# Patient Record
Sex: Male | Born: 1955 | Race: White | Hispanic: No | Smoking: Former smoker
Health system: Southern US, Community
[De-identification: ages and names within clinical notes are randomized; demographics above are authoritative.]

## PROBLEM LIST (undated history)

## (undated) DIAGNOSIS — K8020 Calculus of gallbladder without cholecystitis without obstruction: Secondary | ICD-10-CM

## (undated) DIAGNOSIS — K5090 Crohn's disease, unspecified, without complications: Secondary | ICD-10-CM

## (undated) DIAGNOSIS — K56609 Unspecified intestinal obstruction, unspecified as to partial versus complete obstruction: Secondary | ICD-10-CM

---

## 2005-11-25 ENCOUNTER — Emergency Department (HOSPITAL_COMMUNITY): Admission: EM | Admit: 2005-11-25 | Discharge: 2005-11-25 | Payer: Self-pay | Admitting: Emergency Medicine

## 2013-04-03 ENCOUNTER — Emergency Department (HOSPITAL_COMMUNITY): Payer: Self-pay

## 2013-04-03 ENCOUNTER — Encounter (HOSPITAL_COMMUNITY): Payer: Self-pay | Admitting: Emergency Medicine

## 2013-04-03 ENCOUNTER — Inpatient Hospital Stay (HOSPITAL_COMMUNITY)
Admission: EM | Admit: 2013-04-03 | Discharge: 2013-04-06 | DRG: 386 | Disposition: A | Discharge status: Home / Self Care | Payer: Self-pay | Attending: Internal Medicine | Admitting: Internal Medicine

## 2013-04-03 DIAGNOSIS — K8020 Calculus of gallbladder without cholecystitis without obstruction: Secondary | ICD-10-CM | POA: Diagnosis present

## 2013-04-03 DIAGNOSIS — K5669 Other intestinal obstruction: Secondary | ICD-10-CM | POA: Diagnosis present

## 2013-04-03 DIAGNOSIS — T380X5A Adverse effect of glucocorticoids and synthetic analogues, initial encounter: Secondary | ICD-10-CM | POA: Diagnosis present

## 2013-04-03 DIAGNOSIS — K529 Noninfective gastroenteritis and colitis, unspecified: Secondary | ICD-10-CM | POA: Diagnosis present

## 2013-04-03 DIAGNOSIS — R112 Nausea with vomiting, unspecified: Secondary | ICD-10-CM

## 2013-04-03 DIAGNOSIS — K56609 Unspecified intestinal obstruction, unspecified as to partial versus complete obstruction: Secondary | ICD-10-CM | POA: Diagnosis present

## 2013-04-03 DIAGNOSIS — R1110 Vomiting, unspecified: Secondary | ICD-10-CM | POA: Diagnosis present

## 2013-04-03 DIAGNOSIS — K5090 Crohn's disease, unspecified, without complications: Secondary | ICD-10-CM

## 2013-04-03 DIAGNOSIS — K5000 Crohn's disease of small intestine without complications: Principal | ICD-10-CM | POA: Diagnosis present

## 2013-04-03 DIAGNOSIS — R1115 Cyclical vomiting syndrome unrelated to migraine: Secondary | ICD-10-CM

## 2013-04-03 DIAGNOSIS — Z87891 Personal history of nicotine dependence: Secondary | ICD-10-CM

## 2013-04-03 DIAGNOSIS — K5289 Other specified noninfective gastroenteritis and colitis: Secondary | ICD-10-CM | POA: Diagnosis present

## 2013-04-03 HISTORY — DX: Unspecified intestinal obstruction, unspecified as to partial versus complete obstruction: K56.609

## 2013-04-03 HISTORY — DX: Crohn's disease, unspecified, without complications: K50.90

## 2013-04-03 HISTORY — DX: Calculus of gallbladder without cholecystitis without obstruction: K80.20

## 2013-04-03 LAB — COMPREHENSIVE METABOLIC PANEL
ALT: 17 U/L (ref 0–53)
BUN: 11 mg/dL (ref 6–23)
CO2: 28 mEq/L (ref 19–32)
Chloride: 99 mEq/L (ref 96–112)
Creatinine, Ser: 1.05 mg/dL (ref 0.50–1.35)
GFR calc non Af Amer: 76 mL/min — ABNORMAL LOW (ref >90)
Glucose, Bld: 114 mg/dL — ABNORMAL HIGH (ref 70–99)

## 2013-04-03 LAB — CBC WITH DIFFERENTIAL/PLATELET
HCT: 49.3 % (ref 39.0–52.0)
Hemoglobin: 17 g/dL (ref 13.0–17.0)
MCV: 80.4 fL (ref 78.0–100.0)
Monocytes Absolute: 0.9 10*3/uL (ref 0.1–1.0)
Monocytes Relative: 6 % (ref 3–12)
Neutrophils Relative %: 89 % — ABNORMAL HIGH (ref 43–77)
Platelets: 361 10*3/uL (ref 150–400)
RDW: 14.3 % (ref 11.5–15.5)

## 2013-04-03 LAB — URINALYSIS, ROUTINE W REFLEX MICROSCOPIC
Glucose, UA: NEGATIVE mg/dL
Hgb urine dipstick: NEGATIVE
Nitrite: NEGATIVE
Specific Gravity, Urine: 1.015 (ref 1.005–1.030)
Urobilinogen, UA: 0.2 mg/dL (ref 0.0–1.0)

## 2013-04-03 LAB — LACTIC ACID, PLASMA: Lactic Acid, Venous: 1.9 mmol/L (ref 0.5–2.2)

## 2013-04-03 MED ORDER — IOHEXOL 300 MG/ML  SOLN
100.0000 mL | Freq: Once | INTRAMUSCULAR | Status: AC | PRN
  Administered 2013-04-03: 100 mL via INTRAVENOUS

## 2013-04-03 MED ORDER — METHYLPREDNISOLONE SODIUM SUCC 40 MG IJ SOLR
40.0000 mg | Freq: Two times a day (BID) | INTRAMUSCULAR | Status: DC
  Administered 2013-04-04 – 2013-04-06 (×5): 40 mg via INTRAVENOUS
  Filled 2013-04-03 (×5): qty 1

## 2013-04-03 MED ORDER — CIPROFLOXACIN IN D5W 400 MG/200ML IV SOLN
INTRAVENOUS | Status: AC
  Filled 2013-04-03: qty 200

## 2013-04-03 MED ORDER — MORPHINE SULFATE 4 MG/ML IJ SOLN
4.0000 mg | INTRAMUSCULAR | Status: DC | PRN
  Administered 2013-04-03: 4 mg via INTRAVENOUS
  Filled 2013-04-03: qty 1

## 2013-04-03 MED ORDER — ACETAMINOPHEN 325 MG PO TABS
650.0000 mg | ORAL_TABLET | Freq: Four times a day (QID) | ORAL | Status: DC | PRN
  Administered 2013-04-03: 650 mg via ORAL
  Filled 2013-04-03 (×2): qty 2

## 2013-04-03 MED ORDER — CIPROFLOXACIN IN D5W 400 MG/200ML IV SOLN
400.0000 mg | Freq: Once | INTRAVENOUS | Status: AC
  Administered 2013-04-03: 400 mg via INTRAVENOUS
  Filled 2013-04-03: qty 200

## 2013-04-03 MED ORDER — ONDANSETRON HCL 4 MG PO TABS: 4.0000 mg | ORAL_TABLET | Freq: Four times a day (QID) | ORAL | Status: DC | PRN

## 2013-04-03 MED ORDER — METRONIDAZOLE IN NACL 5-0.79 MG/ML-% IV SOLN
500.0000 mg | Freq: Three times a day (TID) | INTRAVENOUS | Status: DC
  Administered 2013-04-04 – 2013-04-05 (×5): 500 mg via INTRAVENOUS
  Filled 2013-04-03 (×8): qty 100

## 2013-04-03 MED ORDER — POTASSIUM CHLORIDE IN NACL 20-0.9 MEQ/L-% IV SOLN
INTRAVENOUS | Status: DC
  Administered 2013-04-03 – 2013-04-06 (×5): via INTRAVENOUS

## 2013-04-03 MED ORDER — ONDANSETRON HCL 4 MG/2ML IJ SOLN: 4.0000 mg | Freq: Four times a day (QID) | INTRAMUSCULAR | Status: DC | PRN

## 2013-04-03 MED ORDER — HYDROMORPHONE HCL PF 1 MG/ML IJ SOLN: 1.0000 mg | INTRAMUSCULAR | Status: DC | PRN

## 2013-04-03 MED ORDER — PROMETHAZINE HCL 25 MG/ML IJ SOLN
12.5000 mg | Freq: Once | INTRAMUSCULAR | Status: AC
  Administered 2013-04-03: 12.5 mg via INTRAVENOUS
  Filled 2013-04-03: qty 1

## 2013-04-03 MED ORDER — ACETAMINOPHEN 650 MG RE SUPP: 650.0000 mg | Freq: Four times a day (QID) | RECTAL | Status: DC | PRN

## 2013-04-03 MED ORDER — IOHEXOL 300 MG/ML  SOLN
50.0000 mL | Freq: Once | INTRAMUSCULAR | Status: AC | PRN
  Administered 2013-04-03: 50 mL via ORAL

## 2013-04-03 MED ORDER — ONDANSETRON HCL 4 MG/2ML IJ SOLN
4.0000 mg | INTRAMUSCULAR | Status: AC | PRN
  Administered 2013-04-03 (×2): 4 mg via INTRAVENOUS
  Filled 2013-04-03 (×2): qty 2

## 2013-04-03 MED ORDER — ENOXAPARIN SODIUM 40 MG/0.4ML ~~LOC~~ SOLN
40.0000 mg | SUBCUTANEOUS | Status: DC
  Administered 2013-04-04 – 2013-04-05 (×2): 40 mg via SUBCUTANEOUS
  Filled 2013-04-03 (×2): qty 0.4

## 2013-04-03 MED ORDER — MORPHINE SULFATE 4 MG/ML IJ SOLN
4.0000 mg | INTRAMUSCULAR | Status: AC | PRN
  Administered 2013-04-03 (×2): 4 mg via INTRAVENOUS
  Filled 2013-04-03 (×2): qty 1

## 2013-04-03 MED ORDER — CIPROFLOXACIN IN D5W 400 MG/200ML IV SOLN
400.0000 mg | Freq: Two times a day (BID) | INTRAVENOUS | Status: DC
  Administered 2013-04-04 – 2013-04-05 (×3): 400 mg via INTRAVENOUS
  Filled 2013-04-03 (×5): qty 200

## 2013-04-03 MED ORDER — METRONIDAZOLE IN NACL 5-0.79 MG/ML-% IV SOLN
500.0000 mg | Freq: Once | INTRAVENOUS | Status: AC
  Administered 2013-04-03: 500 mg via INTRAVENOUS
  Filled 2013-04-03: qty 100

## 2013-04-03 MED ORDER — SODIUM CHLORIDE 0.9 % IV SOLN
INTRAVENOUS | Status: DC
  Administered 2013-04-03: 14:00:00 via INTRAVENOUS

## 2013-04-03 MED ORDER — METRONIDAZOLE IN NACL 5-0.79 MG/ML-% IV SOLN
INTRAVENOUS | Status: AC
  Filled 2013-04-03: qty 200

## 2013-04-03 NOTE — H&P (Signed)
[facility] History and Physical  Lucas Rhodes ZOX:[id number] DOB: 03/31/1956 DOA: 04/03/2013  Referring physician: Dr. Clarene Duke, ER physician PCP: No PCP Per Patient  Specialists:   Chief Complaint: Abdominal pain and vomiting  HPI: Lucas Rhodes is a 57 y.o. male with a history of inflammatory bowel disease, presents to the emergency room with vomiting and abdominal pain. Patient reports that he was diagnosed approximately 10-12 years ago in [place] and was told that he had ulcerative colitis. There is some confusion between the patient and family members if he was told ulcerative colitis versus Crohn's disease. Patient did not have any medical insurance and was unable to obtain any treatment for this. He reports having chronic diarrhea, ranging between 2-7 times per day. He also occasionally gets abdominal pain with vomiting, but is usually able to get through these episodes. He reports that yesterday, he developed abdominal cramping, diffusely which began in the morning. This progressed throughout the day. In the evening, he developed persistent vomiting. He reports his last normal bowel movement being 2-3 days ago. He's also been able to pass flatus but has been belching. When his vomiting persisted, he presents to the emergency room for evaluation. He denies any hematemesis, melena, hematochezia. CT scan of the abdomen and pelvis indicated an inflammatory colitis as well as a functional bowel obstruction. Patient will be admitted for further treatment.  Review of Systems: Pertinent positives as per history of present illness, otherwise negative  Past Medical History  Diagnosis Date  . Crohn's disease    History reviewed. No pertinent past surgical history. Social History:  reports that he has quit smoking. He does not have any smokeless tobacco history on file. He reports that he does not drink alcohol or use illicit drugs.   No Known Allergies  Family history:  Reviewed, no known history of inflammatory bowel disease  Prior to Admission medications   Medication Sig Start Date End Date Taking? Authorizing Provider  acetaminophen (TYLENOL) 500 MG tablet Take 500 mg by mouth every 6 (six) hours as needed for pain.   Yes Historical Provider, MD  aspirin 81 MG chewable tablet Chew 81 mg by mouth daily.   Yes Historical Provider, MD   Physical Exam: Filed Vitals:   04/03/13 1705  BP: 111/84  Pulse: 83  Temp:   Resp: 18     General:  No acute distress  Eyes: Pupils are equal, round, reactive to  ENT: Mucous membranes are moist, dentition is poor  Neck: Supple  Cardiovascular: S1, S2, regular rate and rhythm  Respiratory: Clear to auscultation bilaterally  Abdomen: Soft, nontender, positive bowel sounds  Skin: No rashes  Musculoskeletal: No pedal edema bilaterally  Psychiatric: Normal affect, cooperative with exam  Neurologic: Grossly intact, nonfocal  Labs on Admission:  Basic Metabolic Panel:  Recent Labs Lab 04/03/13 1356  NA 138  K 3.9  CL 99  CO2 28  GLUCOSE 114*  BUN 11  CREATININE 1.05  CALCIUM 9.8   Liver Function Tests:  Recent Labs Lab 04/03/13 1356  AST 18  ALT 17  ALKPHOS 104  BILITOT 1.4*  PROT 7.6  ALBUMIN 3.6    Recent Labs Lab 04/03/13 1356  LIPASE 17   No results found for this basename: AMMONIA,  in the last 168 hours CBC:  Recent Labs Lab 04/03/13 1356  WBC 14.4*  NEUTROABS 12.7*  HGB 17.0  HCT 49.3  MCV 80.4  PLT 361   Cardiac Enzymes: No results found for  this basename: CKTOTAL, CKMB, CKMBINDEX, TROPONINI,  in the last 168 hours  BNP (last 3 results) No results found for this basename: PROBNP,  in the last 8760 hours CBG: No results found for this basename: GLUCAP,  in the last 168 hours  Radiological Exams on Admission: Dg Chest 2 View  04/03/2013   CLINICAL DATA:  Vomiting and chest and abdominal pain.  EXAM: CHEST  2 VIEW  COMPARISON:  11/25/2005.  FINDINGS: The  cardiac silhouette, mediastinal and hilar contours are normal and stable. The lungs are clear. Stable eventration of the right hemidiaphragm. The bony thorax is intact.  IMPRESSION: No acute cardiopulmonary findings.   Electronically Signed   By: Loralie Champagne M.D.   On: 04/03/2013 14:26   Ct Abdomen Pelvis W Contrast  04/03/2013   CLINICAL DATA:  Nausea, vomiting and diarrhea. History of Crohn's disease and ulcerative colitis.  EXAM: CT ABDOMEN AND PELVIS WITH CONTRAST  TECHNIQUE: Multidetector CT imaging of the abdomen and pelvis was performed using the standard protocol following bolus administration of intravenous contrast.  CONTRAST:  OMNIPAQUE IOHEXOL 300 MG/ML  SOLN  COMPARISON:  None.  FINDINGS: The lung bases are clear.  The liver is unremarkable. No focal lesions or biliary dilatation. The gallbladder is distended and there are numerous layering gallstones. No wall thickening or pericholecystic fluid. No common bowel duct dilatation. The pancreas is normal. The spleen is normal. The adrenal glands and kidneys are normal.  The stomach is mildly distended with contrast. There is a moderate to large duodenum diverticulum noted. The mid distal small bowel loops are dilated and fluid-filled. There is moderate mucosal enhancement involving the distal and terminal ileum which suggests acute inflammation/Crohn's disease. There is also fibrofatty infiltrative changes involving the wall of the small bowel which is consistent with chronic inflammatory bowel disease. No abscess or obstruction.  No mesenteric or retroperitoneal mass or adenopathy. The aorta and branch vessels are normal. The portal, splenic and renal veins are patent. The colon demonstrates mild fibro fatty infiltrated changes also consistent with remote inflammatory bowel disease. No acute inflammation.  The bladder, prostate gland and seminal vesicles are unremarkable. No pelvic mass or adenopathy. No free pelvic fluid collections. No  inguinal mass or adenopathy.  The bony structures are unremarkable.  IMPRESSION: Acute on chronic inflammation involving the distal and terminal ileum suggesting active Crohn's disease superimposed on remote disease. No obstruction or abscess. Mildly dilated more proximal loops of small bowel likely due to a functional obstruction.  Cholelithiasis.   Electronically Signed   By: Loralie Champagne M.D.   On: 04/03/2013 16:22   Assessment/Plan Principal Problem:   Small bowel obstruction Active Problems:   Inflammatory bowel disease   Vomiting   1. Inflammatory bowel disease, possible ulcerative colitis. Patient was started empirically on ciprofloxacin and Flagyl. We will also start him on IV steroids. GI consultation for further guidance. 2. Functional small bowel obstruction. His vomiting appears to have improved since arrival to the hospital. If vomiting does persist, we will have to place NG tube for decompression, patient is agreeable. Keep the patient n.p.o. except ice chips and advance diet as tolerated, once he is moving his bowels. 3. Vomiting. Treat supportively. Provide IV fluids to avoid dehydration.  Code Status: full code Family Communication: discussed with patient and family at the bedside Disposition Plan: discharge home once improved  Time spent: 50 mins  MEMON,JEHANZEB [facility] Pager [phone]  If 7PM-7AM, please contact night-coverage [email or web address] Password [facility] 04/03/2013, 5:51  PM       

## 2013-04-03 NOTE — ED Notes (Addendum)
Pt c/o abd pain with  N/v/d since early this am.  Reports has crohn's disease but isn't on any medication for it.  Reports ate a lot of peanuts yesterday and thinks may have caused his symptoms.  EMS started IV and administered 4mg  zofran iv.

## 2013-04-03 NOTE — ED Provider Notes (Signed)
CSN: [id number]     Arrival date & time 04/03/13  1143 History   First MD Initiated Contact with Patient 04/03/13 1314     Chief Complaint  Patient presents with  . Abdominal Pain  . Emesis    HPI Pt was seen at 1335. Per pt, c/o gradual onset and persistence of multiple intermittent episodes of N/V that began last night.  Has been associated with generalized abd "aching" and "cramping" pain. States he "ate a lot of peanuts" yesterday which has caused these same symptoms previously. States he has not had his usual chronic diarrhea (due to Crohn's vs ulcerative colitis) since yesterday. Endorses he feels "like I have a blockage."  Denies diarrhea, no CP/SOB, no back pain, no fevers, no black or blood in stools or emesis.     Past Medical History  Diagnosis Date  . Crohn's disease    History reviewed. No pertinent past surgical history.  History  Substance Use Topics  . Smoking status: Former Games developer  . Smokeless tobacco: Not on file  . Alcohol Use: No    Review of Systems ROS: Statement: All systems negative except as marked or noted in the HPI; Constitutional: Negative for fever and chills. ; ; Eyes: Negative for eye pain, redness and discharge. ; ; ENMT: Negative for ear pain, hoarseness, nasal congestion, sinus pressure and sore throat. ; ; Cardiovascular: Negative for chest pain, palpitations, diaphoresis, dyspnea and peripheral edema. ; ; Respiratory: Negative for cough, wheezing and stridor. ; ; Gastrointestinal: +N/V, abd pain. Negative for diarrhea, blood in stool, hematemesis, jaundice and rectal bleeding. . ; ; Genitourinary: Negative for dysuria, flank pain and hematuria. ; ; Musculoskeletal: Negative for back pain and neck pain. Negative for swelling and trauma.; ; Skin: Negative for pruritus, rash, abrasions, blisters, bruising and skin lesion.; ; Neuro: Negative for headache, lightheadedness and neck stiffness. Negative for weakness, altered level of consciousness , altered  mental status, extremity weakness, paresthesias, involuntary movement, seizure and syncope.       Allergies  Review of patient's allergies indicates no known allergies.  Home Medications   Current Outpatient Rx  Name  Route  Sig  Dispense  Refill  . acetaminophen (TYLENOL) 500 MG tablet   Oral   Take 500 mg by mouth every 6 (six) hours as needed for pain.         Marland Kitchen aspirin 81 MG chewable tablet   Oral   Chew 81 mg by mouth daily.          BP 113/56  Pulse 99  Temp(Src) 98 F (36.7 C) (Oral)  Resp 18  Ht 5\' 8"  (1.727 m)  Wt 170 lb (77.111 kg)  BMI 25.85 kg/m2 Physical Exam 1340: Physical examination:  Nursing notes reviewed; Vital signs and O2 SAT reviewed;  Constitutional: Well developed, Well nourished, Well hydrated, In no acute distress; Head:  Normocephalic, atraumatic; Eyes: EOMI, PERRL, No scleral icterus; ENMT: Mouth and pharynx normal, Mucous membranes moist; Neck: Supple, Full range of motion, No lymphadenopathy; Cardiovascular: Regular rate and rhythm, No murmur, rub, or gallop; Respiratory: Breath sounds clear & equal bilaterally, No rales, rhonchi, wheezes.  Speaking full sentences with ease, Normal respiratory effort/excursion; Chest: Nontender, Movement normal; Abdomen: Soft, +diffuse tenderness to palp. No rebound or guarding. Nondistended, Normal bowel sounds; Genitourinary: No CVA tenderness; Extremities: Pulses normal, No tenderness, No edema, No calf edema or asymmetry.; Neuro: AA&Ox3, Major CN grossly intact.  Speech clear. No gross focal motor or sensory deficits in extremities.; Skin:  Color normal, Warm, Dry.   ED Course  Procedures   EKG Interpretation   None       MDM  MDM Reviewed: previous chart, nursing note and vitals Reviewed previous: labs Interpretation: labs, x-ray and CT scan     Results for orders placed during the hospital encounter of 04/03/13  CBC WITH DIFFERENTIAL      Result Value Range   WBC 14.4 (*) 4.0 - 10.5 K/uL    RBC 6.13 (*) 4.22 - 5.81 MIL/uL   Hemoglobin 17.0  13.0 - 17.0 g/dL   HCT 16.1  09.6 - 04.5 %   MCV 80.4  78.0 - 100.0 fL   MCH 27.7  26.0 - 34.0 pg   MCHC 34.5  30.0 - 36.0 g/dL   RDW 40.9  81.1 - 91.4 %   Platelets 361  150 - 400 K/uL   Neutrophils Relative % 89 (*) 43 - 77 %   Neutro Abs 12.7 (*) 1.7 - 7.7 K/uL   Lymphocytes Relative 5 (*) 12 - 46 %   Lymphs Abs 0.8  0.7 - 4.0 K/uL   Monocytes Relative 6  3 - 12 %   Monocytes Absolute 0.9  0.1 - 1.0 K/uL   Eosinophils Relative 0  0 - 5 %   Eosinophils Absolute 0.0  0.0 - 0.7 K/uL   Basophils Relative 0  0 - 1 %   Basophils Absolute 0.0  0.0 - 0.1 K/uL  COMPREHENSIVE METABOLIC PANEL      Result Value Range   Sodium 138  135 - 145 mEq/L   Potassium 3.9  3.5 - 5.1 mEq/L   Chloride 99  96 - 112 mEq/L   CO2 28  19 - 32 mEq/L   Glucose, Bld 114 (*) 70 - 99 mg/dL   BUN 11  6 - 23 mg/dL   Creatinine, Ser 7.82  0.50 - 1.35 mg/dL   Calcium 9.8  8.4 - 95.6 mg/dL   Total Protein 7.6  6.0 - 8.3 g/dL   Albumin 3.6  3.5 - 5.2 g/dL   AST 18  0 - 37 U/L   ALT 17  0 - 53 U/L   Alkaline Phosphatase 104  39 - 117 U/L   Total Bilirubin 1.4 (*) 0.3 - 1.2 mg/dL   GFR calc non Af Amer 76 (*) >90 mL/min   GFR calc Af Amer 89 (*) >90 mL/min  LIPASE, BLOOD      Result Value Range   Lipase 17  11 - 59 U/L  LACTIC ACID, PLASMA      Result Value Range   Lactic Acid, Venous 1.9  0.5 - 2.2 mmol/L  URINALYSIS, ROUTINE W REFLEX MICROSCOPIC      Result Value Range   Color, Urine YELLOW  YELLOW   APPearance CLEAR  CLEAR   Specific Gravity, Urine 1.015  1.005 - 1.030   pH 8.5 (*) 5.0 - 8.0   Glucose, UA NEGATIVE  NEGATIVE mg/dL   Hgb urine dipstick NEGATIVE  NEGATIVE   Bilirubin Urine NEGATIVE  NEGATIVE   Ketones, ur NEGATIVE  NEGATIVE mg/dL   Protein, ur NEGATIVE  NEGATIVE mg/dL   Urobilinogen, UA 0.2  0.0 - 1.0 mg/dL   Nitrite NEGATIVE  NEGATIVE   Leukocytes, UA NEGATIVE  NEGATIVE   Dg Chest 2 View 04/03/2013   CLINICAL DATA:  Vomiting and  chest and abdominal pain.  EXAM: CHEST  2 VIEW  COMPARISON:  11/25/2005.  FINDINGS: The cardiac silhouette, mediastinal  and hilar contours are normal and stable. The lungs are clear. Stable eventration of the right hemidiaphragm. The bony thorax is intact.  IMPRESSION: No acute cardiopulmonary findings.   Electronically Signed   By: Loralie Champagne M.D.   On: 04/03/2013 14:26   Ct Abdomen Pelvis W Contrast 04/03/2013   CLINICAL DATA:  Nausea, vomiting and diarrhea. History of Crohn's disease and ulcerative colitis.  EXAM: CT ABDOMEN AND PELVIS WITH CONTRAST  TECHNIQUE: Multidetector CT imaging of the abdomen and pelvis was performed using the standard protocol following bolus administration of intravenous contrast.  CONTRAST:  OMNIPAQUE IOHEXOL 300 MG/ML  SOLN  COMPARISON:  None.  FINDINGS: The lung bases are clear.  The liver is unremarkable. No focal lesions or biliary dilatation. The gallbladder is distended and there are numerous layering gallstones. No wall thickening or pericholecystic fluid. No common bowel duct dilatation. The pancreas is normal. The spleen is normal. The adrenal glands and kidneys are normal.  The stomach is mildly distended with contrast. There is a moderate to large duodenum diverticulum noted. The mid distal small bowel loops are dilated and fluid-filled. There is moderate mucosal enhancement involving the distal and terminal ileum which suggests acute inflammation/Crohn's disease. There is also fibrofatty infiltrative changes involving the wall of the small bowel which is consistent with chronic inflammatory bowel disease. No abscess or obstruction.  No mesenteric or retroperitoneal mass or adenopathy. The aorta and branch vessels are normal. The portal, splenic and renal veins are patent. The colon demonstrates mild fibro fatty infiltrated changes also consistent with remote inflammatory bowel disease. No acute inflammation.  The bladder, prostate gland and seminal vesicles  are unremarkable. No pelvic mass or adenopathy. No free pelvic fluid collections. No inguinal mass or adenopathy.  The bony structures are unremarkable.  IMPRESSION: Acute on chronic inflammation involving the distal and terminal ileum suggesting active Crohn's disease superimposed on remote disease. No obstruction or abscess. Mildly dilated more proximal loops of small bowel likely due to a functional obstruction.  Cholelithiasis.   Electronically Signed   By: Loralie Champagne M.D.   On: 04/03/2013 16:22    1650:  Pt continues to c/o nausea and abd pain despite multiple doses of IV meds. Will re-dose pain meds, IV abx, admit. Dx and testing d/w pt and family.  Questions answered.  Verb understanding, agreeable to admit.  T/C to [facility] Dr. Kerry Hough, case discussed, including:  HPI, pertinent PM/SHx, VS/PE, dx testing, ED course and treatment:  Agreeable to admit, requests to write temporary orders, obtain medical bed to team 1.   Laray Anger, DO 04/06/13 [phone]

## 2013-04-04 DIAGNOSIS — K5000 Crohn's disease of small intestine without complications: Secondary | ICD-10-CM

## 2013-04-04 DIAGNOSIS — K5669 Other intestinal obstruction: Secondary | ICD-10-CM

## 2013-04-04 DIAGNOSIS — K5289 Other specified noninfective gastroenteritis and colitis: Secondary | ICD-10-CM

## 2013-04-04 LAB — CBC
HCT: 42.1 % (ref 39.0–52.0)
Hemoglobin: 14 g/dL (ref 13.0–17.0)
MCH: 27.5 pg (ref 26.0–34.0)
MCHC: 33.3 g/dL (ref 30.0–36.0)
Platelets: 278 10*3/uL (ref 150–400)
RDW: 14.6 % (ref 11.5–15.5)

## 2013-04-04 LAB — BASIC METABOLIC PANEL
BUN: 11 mg/dL (ref 6–23)
Calcium: 8.1 mg/dL — ABNORMAL LOW (ref 8.4–10.5)
Chloride: 103 mEq/L (ref 96–112)
Creatinine, Ser: 1.06 mg/dL (ref 0.50–1.35)
GFR calc Af Amer: 88 mL/min — ABNORMAL LOW (ref >90)
GFR calc non Af Amer: 76 mL/min — ABNORMAL LOW (ref >90)
Glucose, Bld: 99 mg/dL (ref 70–99)
Potassium: 4.1 mEq/L (ref 3.5–5.1)
Sodium: 138 mEq/L (ref 135–145)

## 2013-04-04 LAB — C-REACTIVE PROTEIN: CRP: 4.8 mg/dL — ABNORMAL HIGH (ref <0.60)

## 2013-04-04 NOTE — Plan of Care (Signed)
Dr. Karilyn Cota called and informed that pt records from [facility] were not retrievable.  The hospital had no records relating to that pt and that diagnosis.

## 2013-04-04 NOTE — Progress Notes (Signed)
[facility] PROGRESS NOTE  TAHMID STONEHOCKER ZOX:[id number] DOB: 30-Jun-1955 DOA: 04/03/2013 PCP: No PCP Per Patient  Assessment/Plan: 1. Inflammatory bowel disease. Appreciate GI assistance. Case discussed with Dr. Dionicia Abler and after review of the CT, it appears that patient has findings that may be more consistent with Crohn's disease. Agrees with empiric antibiotics and IV steroids at this time. He will need a colonoscopy once his acute issues have resolved. 2. Functional small bowel obstruction. Patient has not had further vomiting since coming to the hospital. He is starting to pass flatus. Continue current treatments. Start the patient on clear liquids. 3. Vomiting. Appears to have resolved. Continue to treat supportively.  Code Status: full code Family Communication: discussed with patient, no family present Disposition Plan: discharge home once improved   Consultants:  Gastroenterology, Dr. Karilyn Cota  Procedures:  none  Antibiotics:  cipro 10/18>>  Flagyl 10/18>>  HPI/Subjective: Patient is feeling better today. He's not had further vomiting. He is starting to pass flatus.  Objective: Filed Vitals:   04/04/13 1500  BP: 113/71  Pulse: 65  Temp: 97.9 F (36.6 C)  Resp: 19    Intake/Output Summary (Last 24 hours) at 04/04/13 1533 Last data filed at 04/04/13 1500  Gross per 24 hour  Intake 1398.33 ml  Output      0 ml  Net 1398.33 ml   Filed Weights   04/03/13 1146 04/03/13 1902  Weight: 77.111 kg (170 lb) 75.2 kg (165 lb 12.6 oz)    Exam:   General:  NAD  Cardiovascular: S1, S2 RRR  Respiratory: CTA B  Abdomen: soft, nt, nd, bs+  Musculoskeletal: no pedal edema b/l   Data Reviewed: Basic Metabolic Panel:  Recent Labs Lab 04/03/13 1356 04/04/13 0553  NA 138 138  K 3.9 4.1  CL 99 103  CO2 28 28  GLUCOSE 114* 99  BUN 11 11  CREATININE 1.05 1.06  CALCIUM 9.8 8.1*   Liver Function Tests:  Recent Labs Lab 04/03/13 1356  AST 18  ALT  17  ALKPHOS 104  BILITOT 1.4*  PROT 7.6  ALBUMIN 3.6    Recent Labs Lab 04/03/13 1356  LIPASE 17   No results found for this basename: AMMONIA,  in the last 168 hours CBC:  Recent Labs Lab 04/03/13 1356 04/04/13 0553  WBC 14.4* 7.1  NEUTROABS 12.7*  --   HGB 17.0 14.0  HCT 49.3 42.1  MCV 80.4 82.5  PLT 361 278   Cardiac Enzymes: No results found for this basename: CKTOTAL, CKMB, CKMBINDEX, TROPONINI,  in the last 168 hours BNP (last 3 results) No results found for this basename: PROBNP,  in the last 8760 hours CBG: No results found for this basename: GLUCAP,  in the last 168 hours  No results found for this or any previous visit (from the past 240 hour(s)).   Studies: Dg Chest 2 View  04/03/2013   CLINICAL DATA:  Vomiting and chest and abdominal pain.  EXAM: CHEST  2 VIEW  COMPARISON:  11/25/2005.  FINDINGS: The cardiac silhouette, mediastinal and hilar contours are normal and stable. The lungs are clear. Stable eventration of the right hemidiaphragm. The bony thorax is intact.  IMPRESSION: No acute cardiopulmonary findings.   Electronically Signed   By: Loralie Champagne M.D.   On: 04/03/2013 14:26   Ct Abdomen Pelvis W Contrast  04/03/2013   CLINICAL DATA:  Nausea, vomiting and diarrhea. History of Crohn's disease and ulcerative colitis.  EXAM: CT ABDOMEN AND PELVIS WITH  CONTRAST  TECHNIQUE: Multidetector CT imaging of the abdomen and pelvis was performed using the standard protocol following bolus administration of intravenous contrast.  CONTRAST:  OMNIPAQUE IOHEXOL 300 MG/ML  SOLN  COMPARISON:  None.  FINDINGS: The lung bases are clear.  The liver is unremarkable. No focal lesions or biliary dilatation. The gallbladder is distended and there are numerous layering gallstones. No wall thickening or pericholecystic fluid. No common bowel duct dilatation. The pancreas is normal. The spleen is normal. The adrenal glands and kidneys are normal.  The stomach is mildly  distended with contrast. There is a moderate to large duodenum diverticulum noted. The mid distal small bowel loops are dilated and fluid-filled. There is moderate mucosal enhancement involving the distal and terminal ileum which suggests acute inflammation/Crohn's disease. There is also fibrofatty infiltrative changes involving the wall of the small bowel which is consistent with chronic inflammatory bowel disease. No abscess or obstruction.  No mesenteric or retroperitoneal mass or adenopathy. The aorta and branch vessels are normal. The portal, splenic and renal veins are patent. The colon demonstrates mild fibro fatty infiltrated changes also consistent with remote inflammatory bowel disease. No acute inflammation.  The bladder, prostate gland and seminal vesicles are unremarkable. No pelvic mass or adenopathy. No free pelvic fluid collections. No inguinal mass or adenopathy.  The bony structures are unremarkable.  IMPRESSION: Acute on chronic inflammation involving the distal and terminal ileum suggesting active Crohn's disease superimposed on remote disease. No obstruction or abscess. Mildly dilated more proximal loops of small bowel likely due to a functional obstruction.  Cholelithiasis.   Electronically Signed   By: Loralie Champagne M.D.   On: 04/03/2013 16:22    Scheduled Meds: . ciprofloxacin  400 mg Intravenous Q12H  . enoxaparin (LOVENOX) injection  40 mg Subcutaneous Q24H  . methylPREDNISolone (SOLU-MEDROL) injection  40 mg Intravenous Q12H  . metronidazole  500 mg Intravenous Q8H   Continuous Infusions: . 0.9 % NaCl with KCl 20 mEq / L 100 mL/hr at 04/04/13 4782    Principal Problem:   Small bowel obstruction Active Problems:   Inflammatory bowel disease   Vomiting    Time spent:    [facility]  [facility] Pager [phone]. If 7PM-7AM, please contact night-coverage at [email or web address], password [facility] 04/04/2013, 3:33 PM  LOS: 1 day

## 2013-04-04 NOTE — Consult Note (Signed)
Referring Provider: No ref. provider found Primary Care Physician:  No PCP Per Patient Primary Gastroenterologist:  Dr. Karilyn Cota  Reason for Consultation:  Small bowel obstruction in a patient with known history of inflammatory bowel disease.  HPI:  Patient is 57 year old Caucasian male who has history of inflammatory bowel disease with chronic diarrhea and no followup and or medications would develop cramping abdominal pain 2 days did not feel well. He also noted abdominal distention and began vomiting at 2 AM yesterday morning. He therefore came to the emergency room and on evaluation felt to have small bowel obstruction secondary to distal small bowel inflammation consistent with Crohn's disease. He is feeling much better with current therapy. Patient states he was diagnosed with Crohn's disease about 12 years ago. He took medication for a short while but does not remember the specifics. About 10 years ago he was seen by another physician and underwent colonoscopy and was told that he had ulcerative colitis. He he did not return for followup visit because of lack of insurance and his inability to pay for the visit and her medications. He states he is done quite well with chronic diarrhea. He has anywhere from 1-10 stools per day but on most days 3-4. Most of the stools or loose and watery and every now and then semi-formed. He denies melena rectal bleeding fever or chills. Also denies frequent heartburn. He does not believe he has lost any weight. He has not able to walk much over the last few years. 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 He does not take OTC NSAIDs. He occasionally takes Tylenol for headache. He had small BM yesterday morning. Since admission he has not passed any flatus or feces. He has noted significant decrease in abdominal distention since  admission. He presently denies abdominal pain. He is divorced and has 3 grown up children. He smokes cigarettes for 10 years but quit when he was 57 years old. He does not smoke cigarettes. Family history is negative for inflammatory bowel disease. His father is deceased. He lived to be 80. His mother is 19 and in good health. Patient stays with his mother  Past Medical History  Diagnosis Date  . Crohn's disease     History reviewed. No pertinent past surgical history.  Prior to Admission medications   Medication Sig Start Date End Date Taking? Authorizing Provider  acetaminophen (TYLENOL) 500 MG tablet Take 500 mg by mouth every 6 (six) hours as needed for pain.   Yes Historical Provider, MD  aspirin 81 MG chewable tablet Chew 81 mg by mouth daily.   Yes Historical Provider, MD    Current Facility-Administered Medications  Medication Dose Route Frequency Provider Last Rate Last Dose  . 0.9 % NaCl with KCl 20 mEq/ L  infusion   Intravenous Continuous Erick Blinks, MD 100 mL/hr at 04/04/13 0819    . acetaminophen (TYLENOL) tablet 650 mg  650 mg Oral Q6H PRN Erick Blinks, MD   650 mg at 04/03/13 2012   Or  . acetaminophen (TYLENOL) suppository 650 mg  650 mg Rectal Q6H PRN Erick Blinks, MD      . ciprofloxacin (CIPRO) IVPB 400 mg  400 mg Intravenous Q12H Erick Blinks, MD   400 mg at 04/04/13 0551  . enoxaparin (LOVENOX) injection  40 mg  40 mg Subcutaneous Q24H Erick Blinks, MD      . HYDROmorphone (DILAUDID) injection 1 mg  1 mg Intravenous Q4H PRN Erick Blinks, MD      . methylPREDNISolone sodium succinate (SOLU-MEDROL) 40 mg/mL injection 40 mg  40 mg Intravenous Q12H Erick Blinks, MD   40 mg at 04/04/13 0551  . metroNIDAZOLE (FLAGYL) IVPB 500 mg  500 mg Intravenous Q8H Erick Blinks, MD   500 mg at 04/04/13 0851  . ondansetron (ZOFRAN) tablet 4 mg  4 mg Oral Q6H PRN Erick Blinks, MD       Or  . ondansetron (ZOFRAN) injection 4 mg  4 mg Intravenous Q6H PRN Erick Blinks, MD        Allergies as of 04/03/2013  . (No Known Allergies)    History reviewed. No pertinent family history.  History   Social History  . Marital Status: Divorced    Spouse Name: N/A    Number of Children: N/A  . Years of Education: N/A   Occupational History  . Not on file.   Social History Main Topics  . Smoking status: Former Games developer  . Smokeless tobacco: Not on file  . Alcohol Use: No  . Drug Use: No  . Sexual Activity: Not on file   Other Topics Concern  . Not on file   Social History Narrative  . No narrative on file    Review of Systems: See HPI, otherwise normal ROS  Physical Exam: Temp:  [97.6 F (36.4 C)-98.2 F (36.8 C)] 97.6 F (36.4 C) (10/19 0549) Pulse Rate:  [80-90] 80 (10/19 0549) Resp:  [18] 18 (10/18 1902) BP: (103-132)/(62-86) 103/62 mmHg (10/19 0549) SpO2:  [96 %-100 %] 100 % (10/19 0549) Weight:  [165 lb 12.6 oz (75.2 kg)] 165 lb 12.6 oz (75.2 kg) (10/18 1902) Pleasant well-developed well-nourished Caucasian male who is in no acute distress. Conjunctiva is pink. Sclerae nonicteric. Oropharyngeal mucosa is normal. Upper incisors and few teeth in lower jaw are missing. Rest and satisfactory condition. No neck masses or thyromegaly noted. Cardiac exam with irregular rhythm normal S1 and S2. No murmur or gallop noted. Lungs are clear to auscultation. Abdomen is full. Bowel sounds are hyperactive. On palpation is soft with mild tenderness in LUQ and some fullness in right lower quadrant. No organomegaly or masses. Rectal examination deferred. No clubbing or LE edema noted.    Lab Results:  Recent Labs  04/03/13 1356 04/04/13 0553  WBC 14.4* 7.1  HGB 17.0 14.0  HCT 49.3 42.1  PLT 361 278   BMET  Recent Labs  04/03/13 1356 04/04/13 0553  NA 138 138  K 3.9 4.1  CL 99 103  CO2 28 28  GLUCOSE 114* 99  BUN 11 11  CREATININE 1.05 1.06  CALCIUM 9.8 8.1*   LFT  Recent Labs  04/03/13 1356  PROT 7.6  ALBUMIN 3.6   AST 18  ALT 17  ALKPHOS 104  BILITOT 1.4*   PT/INR No results found for this basename: LABPROT, INR,  in the last 72 hours Hepatitis Panel No results found for this basename: HEPBSAG, HCVAB, HEPAIGM, HEPBIGM,  in the last 72 hours  Studies/Results: Dg Chest 2 View  04/03/2013   CLINICAL DATA:  Vomiting and chest and abdominal pain.  EXAM: CHEST  2 VIEW  COMPARISON:  11/25/2005.  FINDINGS: The cardiac silhouette, mediastinal and hilar contours are normal and stable. The lungs are clear. Stable eventration of the right hemidiaphragm. The bony  thorax is intact.  IMPRESSION: No acute cardiopulmonary findings.   Electronically Signed   By: Loralie Champagne M.D.   On: 04/03/2013 14:26   Ct Abdomen Pelvis W Contrast  04/03/2013   CLINICAL DATA:  Nausea, vomiting and diarrhea. History of Crohn's disease and ulcerative colitis.  EXAM: CT ABDOMEN AND PELVIS WITH CONTRAST  TECHNIQUE: Multidetector CT imaging of the abdomen and pelvis was performed using the standard protocol following bolus administration of intravenous contrast.  CONTRAST:  OMNIPAQUE IOHEXOL 300 MG/ML  SOLN  COMPARISON:  None.  FINDINGS: The lung bases are clear.  The liver is unremarkable. No focal lesions or biliary dilatation. The gallbladder is distended and there are numerous layering gallstones. No wall thickening or pericholecystic fluid. No common bowel duct dilatation. The pancreas is normal. The spleen is normal. The adrenal glands and kidneys are normal.  The stomach is mildly distended with contrast. There is a moderate to large duodenum diverticulum noted. The mid distal small bowel loops are dilated and fluid-filled. There is moderate mucosal enhancement involving the distal and terminal ileum which suggests acute inflammation/Crohn's disease. There is also fibrofatty infiltrative changes involving the wall of the small bowel which is consistent with chronic inflammatory bowel disease. No abscess or obstruction.  No  mesenteric or retroperitoneal mass or adenopathy. The aorta and branch vessels are normal. The portal, splenic and renal veins are patent. The colon demonstrates mild fibro fatty infiltrated changes also consistent with remote inflammatory bowel disease. No acute inflammation.  The bladder, prostate gland and seminal vesicles are unremarkable. No pelvic mass or adenopathy. No free pelvic fluid collections. No inguinal mass or adenopathy.  The bony structures are unremarkable.  IMPRESSION: Acute on chronic inflammation involving the distal and terminal ileum suggesting active Crohn's disease superimposed on remote disease. No obstruction or abscess. Mildly dilated more proximal loops of small bowel likely due to a functional obstruction.  Cholelithiasis.   Electronically Signed   By: Loralie Champagne M.D.   On: 04/03/2013 16:22  I have reviewed abdominal pelvic CT.  Assessment; #1. Patient's acute symptomatology consistent with partial small obstruction due to relapse of small bowel Crohn's disease. He may also have colonic disease but his colon does not appear to be source of his acute symptoms. Symptomatically has improved significantly in the last 24 hours but still not passing flatus or feces. Acute symptoms may have been the result of work-related stress. He will need colonoscopy at some point when acute symptoms have resolved. This will be on outpatient basis. #2. Asymptomatic cholelithiasis.  Recommendations; Continue n.p.o. status except for ice ships until he has passed flatus. Once oral feeding is resumed will switch him to prednisone and oral antibiotics. Will begin 6-MP once acute symptoms have resolved possibly at the time of office visit. Will check CRP and TPMT assay.    LOS: 1 day   Mickaela Starlin U  04/04/2013, 1:12 PM

## 2013-04-05 DIAGNOSIS — K5090 Crohn's disease, unspecified, without complications: Secondary | ICD-10-CM

## 2013-04-05 LAB — BASIC METABOLIC PANEL
BUN: 10 mg/dL (ref 6–23)
Chloride: 106 mEq/L (ref 96–112)
GFR calc Af Amer: >90 mL/min (ref >90)
GFR calc non Af Amer: >90 mL/min (ref >90)
Potassium: 4.2 mEq/L (ref 3.5–5.1)
Sodium: 139 mEq/L (ref 135–145)

## 2013-04-05 LAB — CBC
HCT: 42 % (ref 39.0–52.0)
MCHC: 32.9 g/dL (ref 30.0–36.0)
Platelets: 336 10*3/uL (ref 150–400)
RDW: 14.3 % (ref 11.5–15.5)
WBC: 10.4 10*3/uL (ref 4.0–10.5)

## 2013-04-05 MED ORDER — CIPROFLOXACIN HCL 250 MG PO TABS
500.0000 mg | ORAL_TABLET | Freq: Two times a day (BID) | ORAL | Status: DC
  Administered 2013-04-05 – 2013-04-06 (×2): 500 mg via ORAL
  Filled 2013-04-05 (×2): qty 2

## 2013-04-05 MED ORDER — METRONIDAZOLE 500 MG PO TABS
500.0000 mg | ORAL_TABLET | Freq: Two times a day (BID) | ORAL | Status: DC
  Administered 2013-04-05 – 2013-04-06 (×2): 500 mg via ORAL
  Filled 2013-04-05 (×2): qty 1

## 2013-04-05 MED ORDER — PREDNISONE 20 MG PO TABS
40.0000 mg | ORAL_TABLET | Freq: Every day | ORAL | Status: DC
  Administered 2013-04-06: 40 mg via ORAL
  Filled 2013-04-05: qty 2

## 2013-04-05 NOTE — Plan of Care (Signed)
Problem: Phase I Progression Outcomes Goal: OOB as tolerated unless otherwise ordered Outcome: Completed/Met Date Met:  04/05/13 04/05/13 1116 Patient up to chair, ambulates in room and hallway independently. Instructed to call for assistance as needed. Earnstine Regal, RN

## 2013-04-05 NOTE — Care Management Note (Signed)
    Page 1 of 1   04/06/2013     1:01:57 PM   CARE MANAGEMENT NOTE 04/06/2013  Patient:  Lucas Rhodes, Lucas Rhodes   Account Number:  [id number]  Date Initiated:  04/05/2013  Documentation initiated by:  Sharrie Rothman  Subjective/Objective Assessment:   Pt admitted from home with chrons and SBO. Pt lives with his mother and will return home at discharge. 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 Pt does not have any insurance. Pt is independent with ADL's/     Action/Plan:   Kathie Rhodes Financial counselor aware of self pay status. CM may need to give voucher for medications at discharge. Also discussed PCP follow up with [facility] and would be charged sliding scale fee.   Anticipated DC Date:  04/07/2013   Anticipated DC Plan:  HOME/SELF CARE      DC Planning Services  CM consult  MATCH Program      Choice offered to / List presented to:             Status of service:  Completed, signed off Medicare Important Message given?   (If response is "NO", the following Medicare IM given date fields will be blank) Date Medicare IM given:   Date Additional Medicare IM given:    Discharge Disposition:  HOME/SELF CARE  Per UR Regulation:    If discussed at Long Length of Stay Meetings, dates discussed:    Comments:  04/06/13 1300 Arlyss Queen, RN BSN CM Pt discharged home today. MATCH voucher given to pt and pt verbalized understanding. No other CM needs noted.  04/05/13 1050 Arlyss Queen, RN BSN CM

## 2013-04-05 NOTE — Progress Notes (Signed)
UR chart review completed.  

## 2013-04-05 NOTE — Progress Notes (Signed)
[facility] PROGRESS NOTE  Lucas Rhodes:[id number] DOB: 1956/05/02 DOA: 04/03/2013 PCP: No PCP Per Patient    Code Status: Full code Family Communication: Not available Disposition Plan: Eventually discharged home   Consultants:  Gastroenterology  Procedures:  None  Antibiotics:  Cipro 04/03/2013  Flagyl 04/03/2013  HPI/Subjective: The patient is sitting up in a chair. He denies nausea vomiting as his diet has been advanced. He had a small bowel movement this morning that was loose. He denies black tarry stools or bright red blood in his stools.  Objective: Filed Vitals:   04/05/13 0424  BP: 111/75  Pulse: 62  Temp: 97.4 F (36.3 C)  Resp: 18    Intake/Output Summary (Last 24 hours) at 04/05/13 1105 Last data filed at 04/05/13 0700  Gross per 24 hour  Intake   4260 ml  Output      0 ml  Net   4260 ml   Filed Weights   04/03/13 1146 04/03/13 1902  Weight: 77.111 kg (170 lb) 75.2 kg (165 lb 12.6 oz)    Exam:   General:  No acute distress  Cardiovascular: S1, S2, with no murmurs rubs or gallops  Respiratory: Clear to auscultation bilaterally  Abdomen: Positive bowel sounds, soft, mildly tender over the lower quadrants bilaterally without distention or masses palpated.  Musculoskeletal: No pedal edema. No acute hot joints.   Data Reviewed: Basic Metabolic Panel:  Recent Labs Lab 04/03/13 1356 04/04/13 0553 04/05/13 0507  NA 138 138 139  K 3.9 4.1 4.2  CL 99 103 106  CO2 28 28 25   GLUCOSE 114* 99 141*  BUN 11 11 10   CREATININE 1.05 1.06 0.92  CALCIUM 9.8 8.1* 8.7   Liver Function Tests:  Recent Labs Lab 04/03/13 1356  AST 18  ALT 17  ALKPHOS 104  BILITOT 1.4*  PROT 7.6  ALBUMIN 3.6    Recent Labs Lab 04/03/13 1356  LIPASE 17   No results found for this basename: AMMONIA,  in the last 168 hours CBC:  Recent Labs Lab 04/03/13 1356 04/04/13 0553 04/05/13 0507  WBC 14.4* 7.1 10.4  NEUTROABS 12.7*  --   --    HGB 17.0 14.0 13.8  HCT 49.3 42.1 42.0  MCV 80.4 82.5 82.2  PLT 361 278 336   Cardiac Enzymes: No results found for this basename: CKTOTAL, CKMB, CKMBINDEX, TROPONINI,  in the last 168 hours BNP (last 3 results) No results found for this basename: PROBNP,  in the last 8760 hours CBG: No results found for this basename: GLUCAP,  in the last 168 hours  No results found for this or any previous visit (from the past 240 hour(s)).   Studies: Dg Chest 2 View  04/03/2013   CLINICAL DATA:  Vomiting and chest and abdominal pain.  EXAM: CHEST  2 VIEW  COMPARISON:  11/25/2005.  FINDINGS: The cardiac silhouette, mediastinal and hilar contours are normal and stable. The lungs are clear. Stable eventration of the right hemidiaphragm. The bony thorax is intact.  IMPRESSION: No acute cardiopulmonary findings.   Electronically Signed   By: Loralie Champagne M.D.   On: 04/03/2013 14:26   Ct Abdomen Pelvis W Contrast  04/03/2013   CLINICAL DATA:  Nausea, vomiting and diarrhea. History of Crohn's disease and ulcerative colitis.  EXAM: CT ABDOMEN AND PELVIS WITH CONTRAST  TECHNIQUE: Multidetector CT imaging of the abdomen and pelvis was performed using the standard protocol following bolus administration of intravenous contrast.  CONTRAST:  OMNIPAQUE  IOHEXOL 300 MG/ML  SOLN  COMPARISON:  None.  FINDINGS: The lung bases are clear.  The liver is unremarkable. No focal lesions or biliary dilatation. The gallbladder is distended and there are numerous layering gallstones. No wall thickening or pericholecystic fluid. No common bowel duct dilatation. The pancreas is normal. The spleen is normal. The adrenal glands and kidneys are normal.  The stomach is mildly distended with contrast. There is a moderate to large duodenum diverticulum noted. The mid distal small bowel loops are dilated and fluid-filled. There is moderate mucosal enhancement involving the distal and terminal ileum which suggests acute  inflammation/Crohn's disease. There is also fibrofatty infiltrative changes involving the wall of the small bowel which is consistent with chronic inflammatory bowel disease. No abscess or obstruction.  No mesenteric or retroperitoneal mass or adenopathy. The aorta and branch vessels are normal. The portal, splenic and renal veins are patent. The colon demonstrates mild fibro fatty infiltrated changes also consistent with remote inflammatory bowel disease. No acute inflammation.  The bladder, prostate gland and seminal vesicles are unremarkable. No pelvic mass or adenopathy. No free pelvic fluid collections. No inguinal mass or adenopathy.  The bony structures are unremarkable.  IMPRESSION: Acute on chronic inflammation involving the distal and terminal ileum suggesting active Crohn's disease superimposed on remote disease. No obstruction or abscess. Mildly dilated more proximal loops of small bowel likely due to a functional obstruction.  Cholelithiasis.   Electronically Signed   By: Loralie Champagne M.D.   On: 04/03/2013 16:22    Scheduled Meds: . ciprofloxacin  400 mg Intravenous Q12H  . enoxaparin (LOVENOX) injection  40 mg Subcutaneous Q24H  . methylPREDNISolone (SOLU-MEDROL) injection  40 mg Intravenous Q12H  . metronidazole  500 mg Intravenous Q8H   Continuous Infusions: . 0.9 % NaCl with KCl 20 mEq / L 50 mL/hr at 04/05/13 0981    Assessment:  Principal Problem:   Small bowel obstruction Active Problems:   Crohn's disease   Inflammatory bowel disease   Vomiting   1. Small bowel obstruction secondary to relapse of Crohn's disease. He is improving clinically and symptomatically on Cipro/Flagyl and Solu-Medrol. Dr. Karilyn Cota is following; appreciate his input. His diet has been advanced by Dr. Karilyn Cota. He recommends changing antibiotics and steroid therapy to by mouth. TPMT is pending.  Mild hyperglycemia secondary to steroid therapy. We'll follow and treat if his venous glucose is greater  than 150.     Plan: 1. As above. 2. Possible discharge in the next 24 hours. 3. The patient will need primary care physician for continuity of care and assistance with his medications. Will speak to the case manager about this.   Time spent:  20 minutes.    [facility]  [facility] Pager [phone]. If 7PM-7AM, please contact night-coverage at [email or web address], password [facility] 04/05/2013, 11:05 AM  LOS: 2 days

## 2013-04-05 NOTE — Progress Notes (Signed)
Subjective; Patient feels much better. He has minimal discomfort and left upper quadrant. He had no difficulty with clear liquids yesterday and just finishing full liquid breakfast. He had liquid bowel movement at 3 AM. He has been passing  lot of flatus. Objective; BP 111/75  Pulse 62  Temp(Src) 97.4 F (36.3 C) (Oral)  Resp 18  Ht 5\' 8"  (1.727 m)  Wt 165 lb 12.6 oz (75.2 kg)  BMI 25.21 kg/m2  SpO2 96% Patient is alert and in no acute distress. Abdomen is full. Bowel sounds are normal. Abdomen is soft with mild tenderness below left costal margin. No organomegaly or masses. No any edema noted.  Lab data; VBC 10.4, H&H 13.8 and 42.0, platelet count 336K. Serum sodium 139, potassium 4.2, chloride 106, CO2 25, BUN 10, creatinine 0.92. Glucose 141. Serum calcium 8.7. CRP 4.8(normal less than 0.60). TPMT assay pending.  Assessment; Small bowel obstruction secondary to relapse of Crohn's disease. He is responding to medical therapy.  Recommendations; Will advance diet to low residue diet if he does well with full liquids at lunch. Will change antibiotics to oral route this evening and transition to oral steroid in the a.m. I have talked with social service as he would need assistance with medications etc.

## 2013-04-05 NOTE — Progress Notes (Signed)
Patient has had no problem with full liquids. Diet advance to low-residue diet. DC Solu-Medrol after her dose at 6 PM and begin prednisone 40 mg by mouth every morning. Change Cipro and metronidazole to oral route.

## 2013-04-06 ENCOUNTER — Encounter (HOSPITAL_COMMUNITY): Payer: Self-pay | Admitting: Internal Medicine

## 2013-04-06 DIAGNOSIS — K8020 Calculus of gallbladder without cholecystitis without obstruction: Secondary | ICD-10-CM | POA: Diagnosis present

## 2013-04-06 HISTORY — DX: Calculus of gallbladder without cholecystitis without obstruction: K80.20

## 2013-04-06 LAB — BASIC METABOLIC PANEL
CO2: 26 mEq/L (ref 19–32)
Calcium: 9 mg/dL (ref 8.4–10.5)
GFR calc Af Amer: >90 mL/min (ref >90)
GFR calc non Af Amer: >90 mL/min (ref >90)
Glucose, Bld: 133 mg/dL — ABNORMAL HIGH (ref 70–99)
Potassium: 3.8 mEq/L (ref 3.5–5.1)
Sodium: 139 mEq/L (ref 135–145)

## 2013-04-06 MED ORDER — CIPROFLOXACIN HCL 500 MG PO TABS: 500.0000 mg | ORAL_TABLET | Freq: Two times a day (BID) | ORAL | Status: AC

## 2013-04-06 MED ORDER — MERCAPTOPURINE 50 MG PO TABS: 100.0000 mg | ORAL_TABLET | Freq: Every day | ORAL | Status: DC

## 2013-04-06 MED ORDER — PREDNISONE 10 MG PO TABS: ORAL_TABLET | ORAL | Status: AC

## 2013-04-06 MED ORDER — METRONIDAZOLE 500 MG PO TABS: 500.0000 mg | ORAL_TABLET | Freq: Two times a day (BID) | ORAL | Status: DC

## 2013-04-06 NOTE — Discharge Summary (Signed)
Physician Discharge Summary  Lucas Rhodes ZOX:[id number] DOB: 1955/07/25 DOA: 04/03/2013  PCP: No PCP Per Patient  Admit date: 04/03/2013 Discharge date: 04/06/2013  Time spent: Greater than 30 minutes  Recommendations for Outpatient Follow-up:  1. He will followup with Dr. Karilyn Cota as scheduled.  Discharge Diagnoses:    1. Inflammatory bowel disease, consistent with Crohn's disease with exacerbation. 2. Small bowel obstruction secondary to Crohn's disease exacerbation. Resolved medically. 3. Abdominal pain, nausea, vomiting secondary to #1. Resolved. 4. Asymptomatic cholelithiasis. 5. Mild steroid-induced hyperglycemia.  Discharge Condition: Improved.  Diet recommendation: Heart healthy/low fiber.  Filed Weights   04/03/13 1146 04/03/13 1902  Weight: 77.111 kg (170 lb) 75.2 kg (165 lb 12.6 oz)    History of present illness:  Lucas Rhodes is a 57 y.o. male with a history of inflammatory bowel disease, presented to the emergency room with vomiting and abdominal pain. Patient reported that he was diagnosed approximately 10-12 years ago in [place] and was told that he had ulcerative colitis. There was some confusion between the patient and family members if he was told ulcerative colitis versus Crohn's disease. Patient did not have any medical insurance and was unable to obtain any treatment for this. He reported having chronic diarrhea, ranging between 2-7 times per day. He also had occasional abdominal pain with vomiting, but he is usually able to get through these episodes. He reported that he developed abdominal cramping, diffusely which began in the morning. This progressed throughout the day. In the evening, he developed persistent vomiting. He reported his last normal bowel movement being 2-3 days ago. He had also been able to pass flatus and had been belching. When his vomiting persisted, he presented to the emergency room for evaluation. He denied any hematemesis,  melena, or  hematochezia. CT scan of the abdomen and pelvis indicated an inflammatory colitis as well as a functional bowel obstruction. Patient was admitted for further treatment.   Hospital Course:  The patient was started on treatment with IV Cipro and Flagyl empirically. Also, IV Solu-Medrol was given. IV fluid hydration was given as well. His pain was treated with as needed analgesics. His nausea and vomiting were treated with as needed antiemetics. He was made virtually n.p.o. for bowel rest. If his vomiting persisted, and NG tube was considered. However his vomiting subsided and then resolved. Gastroenterology was consulted. Dr. Karilyn Cota provided the evaluation and recommendations. Per Dr. Karilyn Cota, the patient's clinical findings were suggestive of small bowel obstruction due to relapse of small bowel Crohn's disease. He may also have a colonic component, but his colon did not appear to be the source of his acute symptoms. His acute symptoms may have been the result of work-related stress. Dr. Karilyn Cota stated that at some point, he would need an elective colonoscopy.  Eventually, the patient's pain, nausea, and vomiting completely resolve. He remained afebrile and hemodynamically stable. He had mild steroid-induced hyperglycemia, which did not need treatment with insulin. He began to have loose bowel movements which returned to baseline per his account. His diet was advanced which he tolerated well. His antibiotics were changed to the oral route and Solu-Medrol was changed to prednisone. He will continue on Cipro and Flagyl for 2 more weeks. He will also continue on a prednisone taper over the next few weeks as recommended by Dr. Karilyn Cota. He also prescribed mercaptopurine. The case management assisted the patient with medications with a medication voucher.  Procedures:  None  Consultations:  Gastroenterologist, Dr. Karilyn Cota  Discharge  Exam: Filed Vitals:   04/06/13 0611  BP: 119/75  Pulse: 50   Temp: 97.4 F (36.3 C)  Resp: 20    General: No acute distress. Cardiovascular: S1, S2, with no murmurs rubs or gallops. Respiratory: Clear to auscultation bilaterally. Abdomen: Positive bowel sounds, soft, nontender, nondistended.  Discharge Instructions  Discharge Orders   Future Appointments Provider Department Dept Phone   04/20/2013 9:00 AM Malissa Hippo, MD [facility] [phone]   Future Orders Complete By Expires   Diet general  As directed    Increase activity slowly  As directed        Medication List    STOP taking these medications       aspirin 81 MG chewable tablet      TAKE these medications       acetaminophen 500 MG tablet  Commonly known as:  TYLENOL  Take 500 mg by mouth every 6 (six) hours as needed for pain.     ciprofloxacin 500 MG tablet  Commonly known as:  CIPRO  Take 1 tablet (500 mg total) by mouth 2 (two) times daily. Take for 2 more weeks.     mercaptopurine 50 MG tablet  Commonly known as:  PURINETHOL  - Take 2 tablets (100 mg total) by mouth daily. Give on an empty stomach 1 hour before or 2 hours after meals. Caution: Chemotherapy.  - For treatment of Crohn's disease.     metroNIDAZOLE 500 MG tablet  Commonly known as:  FLAGYL  Take 1 tablet (500 mg total) by mouth every 12 (twelve) hours. Take for 2 more weeks     predniSONE 10 MG tablet  Commonly known as:  DELTASONE  Starting tomorrow, take 4 pills daily for 7 days; then 3-1/2 pills daily for 7 days; then take 3 pills daily for 7 days; then take 2-1/2 pills daily for 7 days; then take 2 pills daily for 7 days; then take 1-1/2 pills daily for 7 days; then take 1 pill daily for 7 days; then take a half a pill daily for 7 days then stop.       No Known Allergies     Follow-up Information   Follow up with REHMAN,NAJEEB U, MD. (His office will call you with the appointment.)    Specialty:  Gastroenterology   Contact information:   [address],  [address] [place] [address] [phone]        The results of significant diagnostics from this hospitalization (including imaging, microbiology, ancillary and laboratory) are listed below for reference.    Significant Diagnostic Studies: Dg Chest 2 View  04/03/2013   CLINICAL DATA:  Vomiting and chest and abdominal pain.  EXAM: CHEST  2 VIEW  COMPARISON:  11/25/2005.  FINDINGS: The cardiac silhouette, mediastinal and hilar contours are normal and stable. The lungs are clear. Stable eventration of the right hemidiaphragm. The bony thorax is intact.  IMPRESSION: No acute cardiopulmonary findings.   Electronically Signed   By: Loralie Champagne M.D.   On: 04/03/2013 14:26   Ct Abdomen Pelvis W Contrast  04/03/2013   CLINICAL DATA:  Nausea, vomiting and diarrhea. History of Crohn's disease and ulcerative colitis.  EXAM: CT ABDOMEN AND PELVIS WITH CONTRAST  TECHNIQUE: Multidetector CT imaging of the abdomen and pelvis was performed using the standard protocol following bolus administration of intravenous contrast.  CONTRAST:  OMNIPAQUE IOHEXOL 300 MG/ML  SOLN  COMPARISON:  None.  FINDINGS: The lung bases are  clear.  The liver is unremarkable. No focal lesions or biliary dilatation. The gallbladder is distended and there are numerous layering gallstones. No wall thickening or pericholecystic fluid. No common bowel duct dilatation. The pancreas is normal. The spleen is normal. The adrenal glands and kidneys are normal.  The stomach is mildly distended with contrast. There is a moderate to large duodenum diverticulum noted. The mid distal small bowel loops are dilated and fluid-filled. There is moderate mucosal enhancement involving the distal and terminal ileum which suggests acute inflammation/Crohn's disease. There is also fibrofatty infiltrative changes involving the wall of the small bowel which is consistent with chronic inflammatory bowel disease. No abscess or obstruction.  No mesenteric or  retroperitoneal mass or adenopathy. The aorta and branch vessels are normal. The portal, splenic and renal veins are patent. The colon demonstrates mild fibro fatty infiltrated changes also consistent with remote inflammatory bowel disease. No acute inflammation.  The bladder, prostate gland and seminal vesicles are unremarkable. No pelvic mass or adenopathy. No free pelvic fluid collections. No inguinal mass or adenopathy.  The bony structures are unremarkable.  IMPRESSION: Acute on chronic inflammation involving the distal and terminal ileum suggesting active Crohn's disease superimposed on remote disease. No obstruction or abscess. Mildly dilated more proximal loops of small bowel likely due to a functional obstruction.  Cholelithiasis.   Electronically Signed   By: Loralie Champagne M.D.   On: 04/03/2013 16:22    Microbiology: No results found for this or any previous visit (from the past 240 hour(s)).   Labs: Basic Metabolic Panel:  Recent Labs Lab 04/03/13 1356 04/04/13 0553 04/05/13 0507 04/06/13 0516  NA 138 138 139 139  K 3.9 4.1 4.2 3.8  CL 99 103 106 103  CO2 28 28 25 26   GLUCOSE 114* 99 141* 133*  BUN 11 11 10 11   CREATININE 1.05 1.06 0.92 0.94  CALCIUM 9.8 8.1* 8.7 9.0   Liver Function Tests:  Recent Labs Lab 04/03/13 1356  AST 18  ALT 17  ALKPHOS 104  BILITOT 1.4*  PROT 7.6  ALBUMIN 3.6    Recent Labs Lab 04/03/13 1356  LIPASE 17   No results found for this basename: AMMONIA,  in the last 168 hours CBC:  Recent Labs Lab 04/03/13 1356 04/04/13 0553 04/05/13 0507  WBC 14.4* 7.1 10.4  NEUTROABS 12.7*  --   --   HGB 17.0 14.0 13.8  HCT 49.3 42.1 42.0  MCV 80.4 82.5 82.2  PLT 361 278 336   Cardiac Enzymes: No results found for this basename: CKTOTAL, CKMB, CKMBINDEX, TROPONINI,  in the last 168 hours BNP: BNP (last 3 results) No results found for this basename: PROBNP,  in the last 8760 hours CBG: No results found for this basename: GLUCAP,  in  the last 168 hours     Signed:  Farheen Pfahler  [facility] 04/06/2013, 6:26 PM

## 2013-04-06 NOTE — Progress Notes (Signed)
AVS reviewed with patient.  Prescriptions provided to patient.  Voucher provided to patient.  Pt verbalized understanding of d/c instructions and physician follow-up.  Pt's IV removed.  Site WNL. Pt states belongings intact and in possession at time of discharge.  Patient escorted by NT to main entrance for discharge.  Pt stable at time of discharge.

## 2013-04-06 NOTE — Progress Notes (Signed)
Patient ID: Lucas Rhodes, male   DOB: 12/26/1955, 57 y.o.   MRN: [id number] Feel better. No abdominal distention. Ate all of breakfast. Last BM yesterday. Stool was soft. Tawana Scale Vitals:   04/05/13 0424 04/05/13 1400 04/05/13 2121 04/06/13 0611  BP: 111/75 120/71 116/72 119/75  Pulse: 62 96 62 50  Temp: 97.4 F (36.3 C) 97.6 F (36.4 C) 97.5 F (36.4 C) 97.4 F (36.3 C)  TempSrc: Oral Oral    Resp: 18 18 18 20   Height:      Weight:      SpO2: 96% 100% 100% 98%  alert. Lungs are clear. Abdomen is soft. BS+. Very slight tenderness left abdomen. A. Crohns disease. He feels better. Plan: May be dischage today. Will start on a Prednisone taper. Cipro 500mg  BID and Flagyl 500mg  x 2 week. Will start on at 100mg  daily once we have his TPMT back.  OV in 2 weeks.

## 2013-04-07 ENCOUNTER — Encounter (INDEPENDENT_AMBULATORY_CARE_PROVIDER_SITE_OTHER): Payer: Self-pay | Admitting: *Deleted

## 2013-04-12 ENCOUNTER — Telehealth (INDEPENDENT_AMBULATORY_CARE_PROVIDER_SITE_OTHER): Payer: Self-pay | Admitting: *Deleted

## 2013-04-12 NOTE — Telephone Encounter (Signed)
Lucas Rhodes call stating he didn't start the medications given to him and it has been about 5 days. Would like to know if he should? Per Dr. Karilyn Cota, Loraine Leriche need to follow his discharge instructions. Patient advised and voices understood. The answer to his question is yes.

## 2013-04-20 ENCOUNTER — Ambulatory Visit (INDEPENDENT_AMBULATORY_CARE_PROVIDER_SITE_OTHER): Payer: Self-pay | Admitting: Internal Medicine

## 2013-04-20 ENCOUNTER — Encounter (INDEPENDENT_AMBULATORY_CARE_PROVIDER_SITE_OTHER): Payer: Self-pay | Admitting: Internal Medicine

## 2013-04-20 VITALS — BP 128/80 | HR 76 | Temp 97.3°F | Resp 18 | Ht 68.0 in | Wt 156.4 lb

## 2013-04-20 DIAGNOSIS — K5000 Crohn's disease of small intestine without complications: Secondary | ICD-10-CM

## 2013-04-20 DIAGNOSIS — R42 Dizziness and giddiness: Secondary | ICD-10-CM

## 2013-04-20 MED ORDER — MERCAPTOPURINE 50 MG PO TABS: 100.0000 mg | ORAL_TABLET | Freq: Every day | ORAL | Status: AC

## 2013-04-20 NOTE — Progress Notes (Addendum)
Presenting complaint;  Followup for small bowel Crohn's disease.  Subjective:  Patient is 57 year old Caucasian male who has a 12 year history of Crohn disease on no maintenance therapy who was admitted at [facility] on 04/03/2013 with small bowel obstruction and responded to medical treatment. He was discharged on prednisone Cipro and metronidazole. He was also given prescription for 6-MP which he has not started yet. He states he has not experienced abdominal pain nausea vomiting melena or rectal bleeding and he is having 2 semi-formed to soft stools daily. However he's been experiencing dizziness and tinnitus and he went to [facility] emergency room on 04/15/2013 is advised to cut back on his prednisone. He is feeling some better. He states his appetite is decent and he has not lost any weight since her recent hospitalization.  Current Medications: Current Outpatient Prescriptions  Medication Sig Dispense Refill  . acetaminophen (TYLENOL) 500 MG tablet Take 500 mg by mouth every 6 (six) hours as needed for pain.      . ciprofloxacin (CIPRO) 500 MG tablet Take 1 tablet (500 mg total) by mouth 2 (two) times daily. Take for 2 more weeks.  28 tablet  0  . mercaptopurine (PURINETHOL) 50 MG tablet Take 2 tablets (100 mg total) by mouth daily. Give on an empty stomach 1 hour before or 2 hours after meals. Caution: Chemotherapy. For treatment of Crohn's disease.  60 tablet  0  . metroNIDAZOLE (FLAGYL) 500 MG tablet Take 1 tablet (500 mg total) by mouth every 12 (twelve) hours. Take for 2 more weeks  28 tablet  0  . predniSONE (DELTASONE) 10 MG tablet Starting tomorrow, take 4 pills daily for 7 days; then 3-1/2 pills daily for 7 days; then take 3 pills daily for 7 days; then take 2-1/2 pills daily for 7 days; then take 2 pills daily for 7 days; then take 1-1/2 pills daily for 7 days; then take 1 pill daily for 7 days; then take a half a pill daily for 7 days then stop.  126 tablet  0   No  current facility-administered medications for this visit.     Objective: Blood pressure 128/80, pulse 76, temperature 97.3 F (36.3 C), temperature source Oral, resp. rate 18, height 5\' 8"  (1.727 m), weight 156 lb 6.4 oz (70.943 kg). Patient is alert and in no acute distress. Conjunctiva is pink. Sclera is nonicteric Oropharyngeal mucosa is normal. No neck masses or thyromegaly noted. Abdomen is symmetrical. Bowel sounds are normal. Abdomen is soft and nontender without organomegaly or masses.  No LE edema or clubbing noted.  Labs/studies Results: C-reactive protein on 04/04/2013 was 4.8. TPMT assay result is still pending. Lab called and result requested  Assessment:  #1. Small bowel Crohn's disease with recent flareup resulting in small bowel obstruction responding to medical therapy. He is not having any GI symptoms. Dizziness and tinnitus may be secondary to metronidazole. He will be finishing Cipro in 2 days. #2. Dizziness and tinnitus possibly secondary to metronidazole.   Plan:  Discontinue metronidazole. Prednisone schedule as follows; 20 mg daily for one week. 15 mg daily for one week. 10 mg daily for one week. 5 mg daily for one week and stop. 6-MP 100 mg by mouth daily. CBC with differential in 2 weeks. Patient advised not to take OTC NSAIDs. Patient encouraged to contact us immediately if he has new symptoms or if he believes he is having side effects with medication. Office visit in 8 weeks.    Lab  result TPMT activity 25.9 EU which is  within normal limits.

## 2013-04-20 NOTE — Patient Instructions (Addendum)
Discontinue Flagyl or metronidazole. Prednisone schedule as follows; 20 mg daily for one week. 15 mg daily for one week. 10 mg daily for one week. 5 mg daily for one week and stop. Lab work for CBC to be done on 05/04/2013. Please remember not to take ibuprofen, Naprosyn or other arthritis medications

## 2013-04-21 ENCOUNTER — Telehealth (INDEPENDENT_AMBULATORY_CARE_PROVIDER_SITE_OTHER): Payer: Self-pay | Admitting: *Deleted

## 2013-04-21 DIAGNOSIS — K5090 Crohn's disease, unspecified, without complications: Secondary | ICD-10-CM

## 2013-04-21 NOTE — Telephone Encounter (Signed)
Per Dr.Rehman the patient will need to have labs drawn 2 weeks.

## 2013-04-22 ENCOUNTER — Encounter (INDEPENDENT_AMBULATORY_CARE_PROVIDER_SITE_OTHER): Payer: Self-pay

## 2013-04-22 ENCOUNTER — Encounter (INDEPENDENT_AMBULATORY_CARE_PROVIDER_SITE_OTHER): Payer: Self-pay | Admitting: *Deleted

## 2013-04-25 ENCOUNTER — Inpatient Hospital Stay: Payer: Self-pay | Admitting: Psychiatry

## 2013-04-25 LAB — COMPREHENSIVE METABOLIC PANEL
Alkaline Phosphatase: 88 U/L (ref 50–136)
BUN: 5 mg/dL — ABNORMAL LOW (ref 7–18)
Creatinine: 0.94 mg/dL (ref 0.60–1.30)
EGFR (Non-African Amer.): >60
Glucose: 108 mg/dL — ABNORMAL HIGH (ref 65–99)
Potassium: 3.7 mmol/L (ref 3.5–5.1)
SGPT (ALT): 32 U/L (ref 12–78)
Sodium: 137 mmol/L (ref 136–145)
Total Protein: 6.5 g/dL (ref 6.4–8.2)

## 2013-04-25 LAB — URINALYSIS, COMPLETE
Blood: NEGATIVE
Glucose,UR: NEGATIVE mg/dL (ref 0–75)
Leukocyte Esterase: NEGATIVE
Nitrite: NEGATIVE
Protein: NEGATIVE
RBC,UR: <1 /HPF (ref 0–5)
Specific Gravity: 1.001 (ref 1.003–1.030)
Squamous Epithelial: NONE SEEN

## 2013-04-25 LAB — CBC
HGB: 15.1 g/dL (ref 13.0–18.0)
MCV: 80 fL (ref 80–100)
Platelet: 344 10*3/uL (ref 150–440)
RDW: 15.7 % — ABNORMAL HIGH (ref 11.5–14.5)

## 2013-04-25 LAB — DRUG SCREEN, URINE
Benzodiazepine, Ur Scrn: NEGATIVE (ref ?–200)
Cannabinoid 50 Ng, Ur ~~LOC~~: NEGATIVE (ref ?–50)
Cocaine Metabolite,Ur ~~LOC~~: NEGATIVE (ref ?–300)
MDMA (Ecstasy)Ur Screen: NEGATIVE (ref ?–500)
Methadone, Ur Screen: NEGATIVE (ref ?–300)

## 2013-04-30 DIAGNOSIS — R000 Tachycardia, unspecified: Secondary | ICD-10-CM

## 2013-05-05 ENCOUNTER — Other Ambulatory Visit (INDEPENDENT_AMBULATORY_CARE_PROVIDER_SITE_OTHER): Payer: Self-pay | Admitting: *Deleted

## 2013-05-05 ENCOUNTER — Encounter (INDEPENDENT_AMBULATORY_CARE_PROVIDER_SITE_OTHER): Payer: Self-pay | Admitting: *Deleted

## 2013-05-05 DIAGNOSIS — K5000 Crohn's disease of small intestine without complications: Secondary | ICD-10-CM

## 2013-05-17 LAB — MISCELLANEOUS TEST

## 2013-06-29 ENCOUNTER — Ambulatory Visit (INDEPENDENT_AMBULATORY_CARE_PROVIDER_SITE_OTHER): Payer: Self-pay | Admitting: Internal Medicine

## 2014-05-29 IMAGING — CT CT ABD-PELV W/ CM
2 of 4 series · 15 of 46 positions shown, 17 images · IV contrast (Omnipaque 300)
Comparison: None.

CLINICAL DATA: Nausea, vomiting and diarrhea. History of Crohn's
disease and ulcerative colitis.

EXAM:
CT ABDOMEN AND PELVIS WITH CONTRAST
TECHNIQUE: Multidetector CT imaging of the abdomen and pelvis was performed
using the standard protocol following bolus administration of
intravenous contrast.
CONTRAST:  100mL OMNIPAQUE IOHEXOL 300 MG/ML  SOLN

[Series 2: abd_pel_with 5.0 b40f · axial · 0.67mm/px · z∈[+552,+998]mm · 12 of 99 slices shown, 14 images]
[im 5/99  soft-tissue]
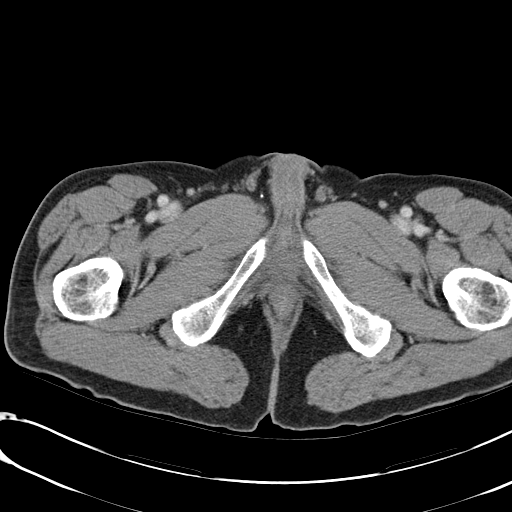
[im 5/99  bone]
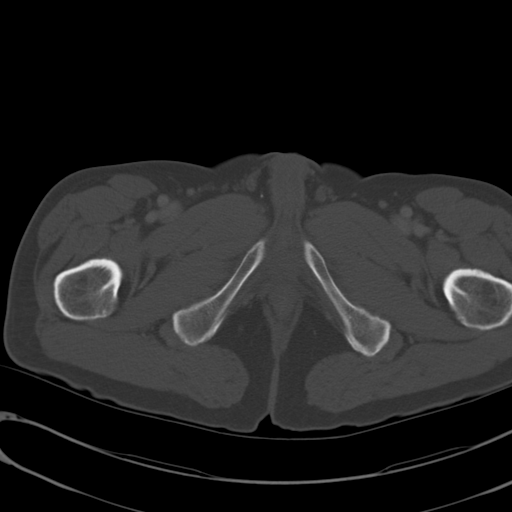
[im 15/99  soft-tissue]
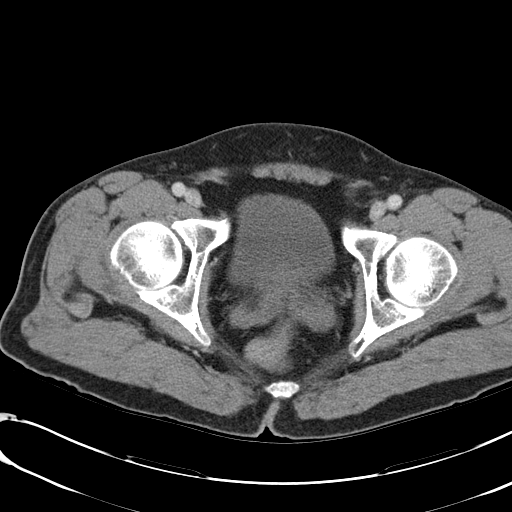
[im 24/99  soft-tissue]
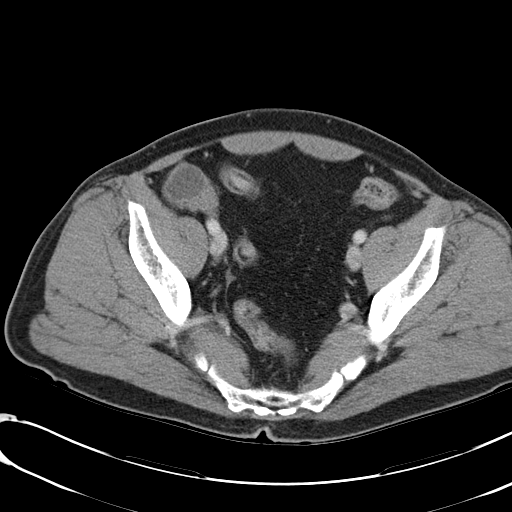
[im 29/99  soft-tissue]
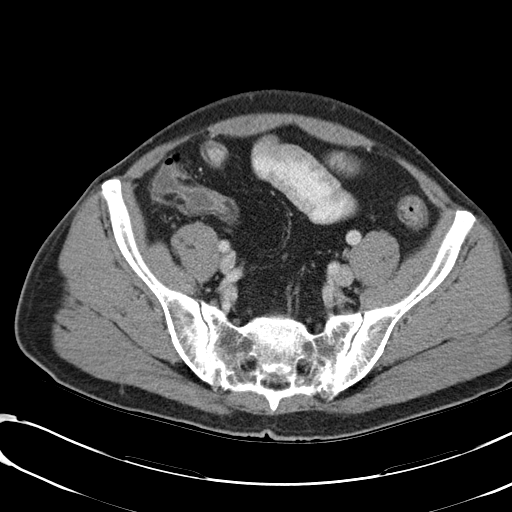
[im 38/99  soft-tissue]
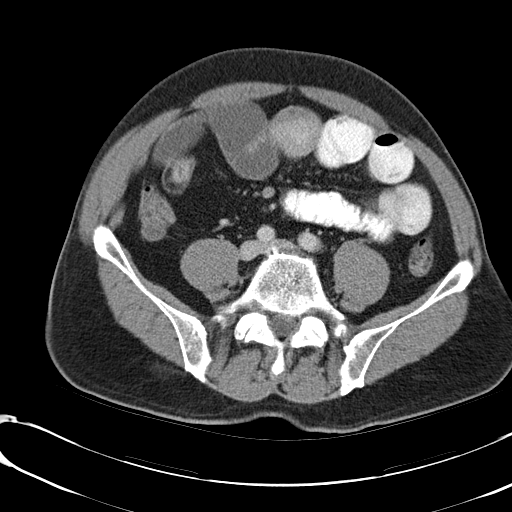
[im 47/99  soft-tissue]
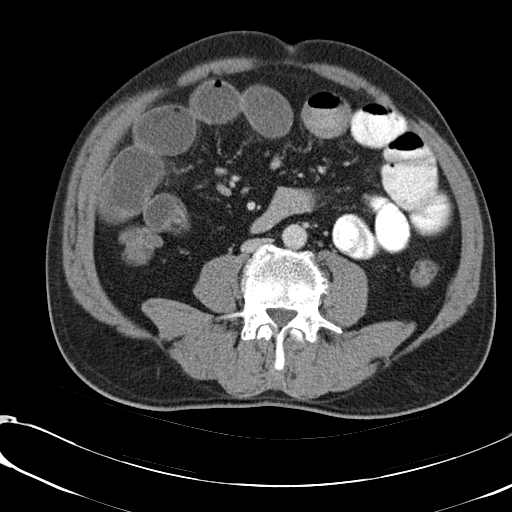
[im 52/99  soft-tissue]
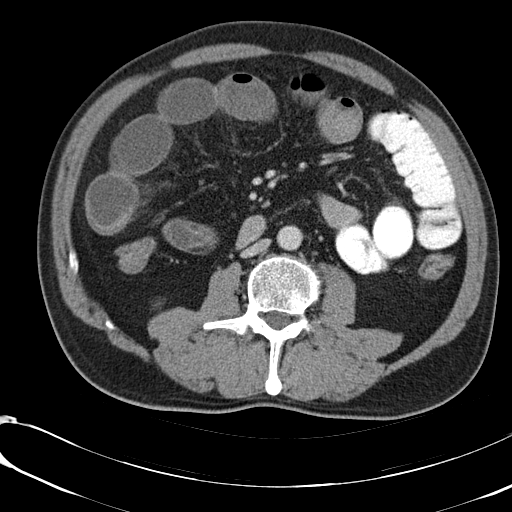
[im 61/99  soft-tissue]
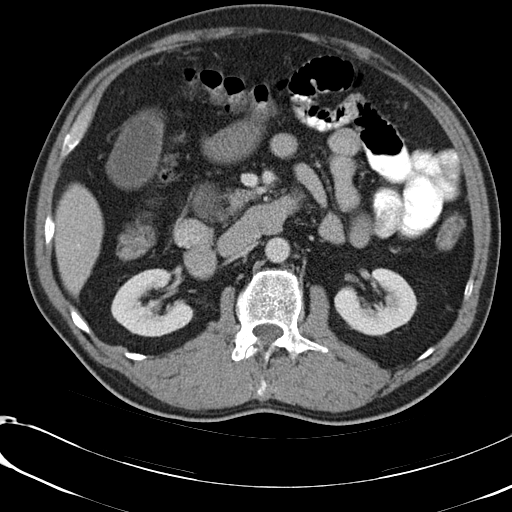
[im 71/99  soft-tissue]
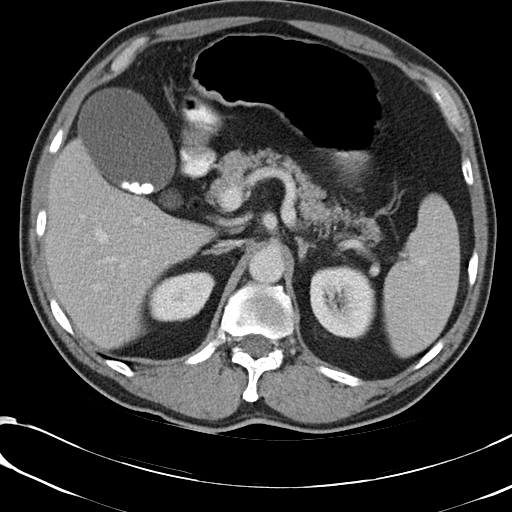
[im 71/99  bone]
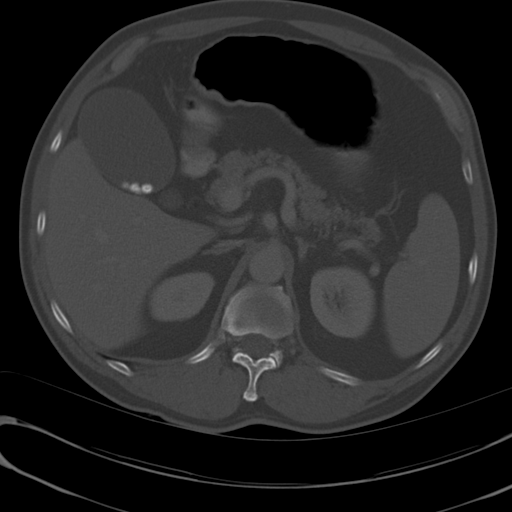
[im 75/99  soft-tissue]
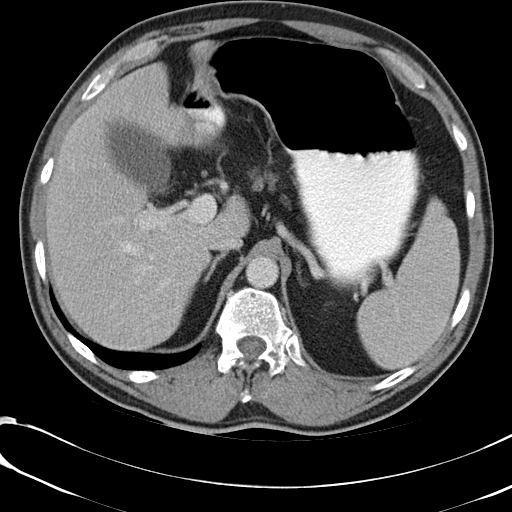
[im 85/99  soft-tissue]
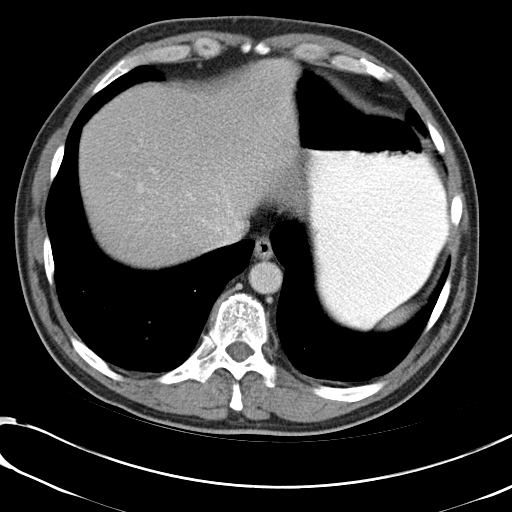
[im 94/99  soft-tissue]
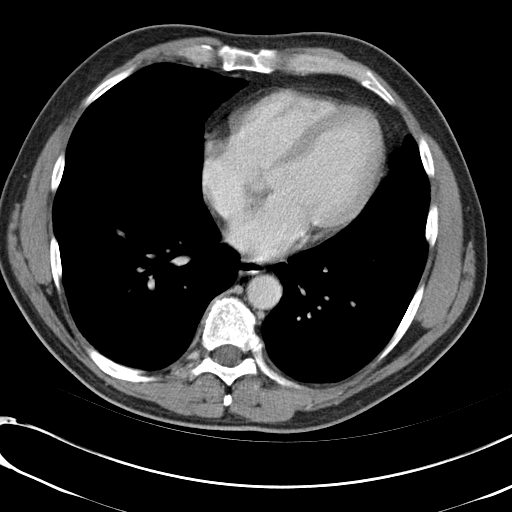

[Series 4: abd_pel_with 3.0 spo · coronal · 0.60mm/px · 3 of 89 slices shown]
[im 30/89  soft-tissue]
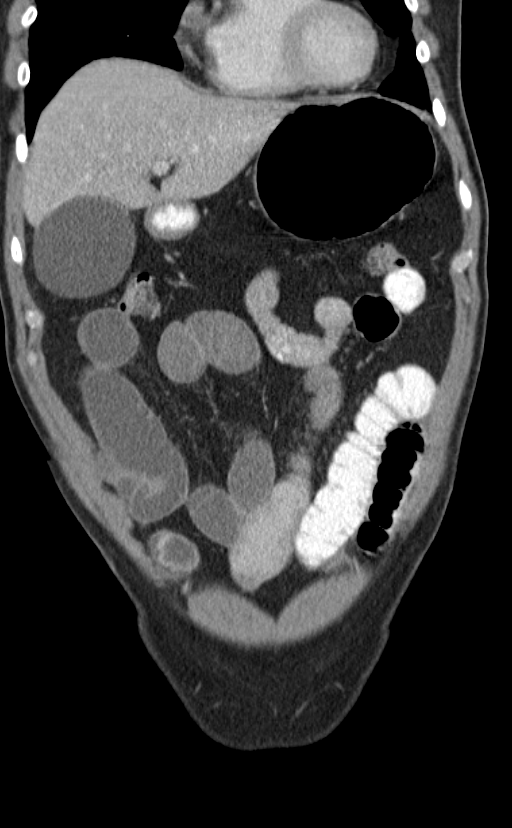
[im 40/89  soft-tissue]
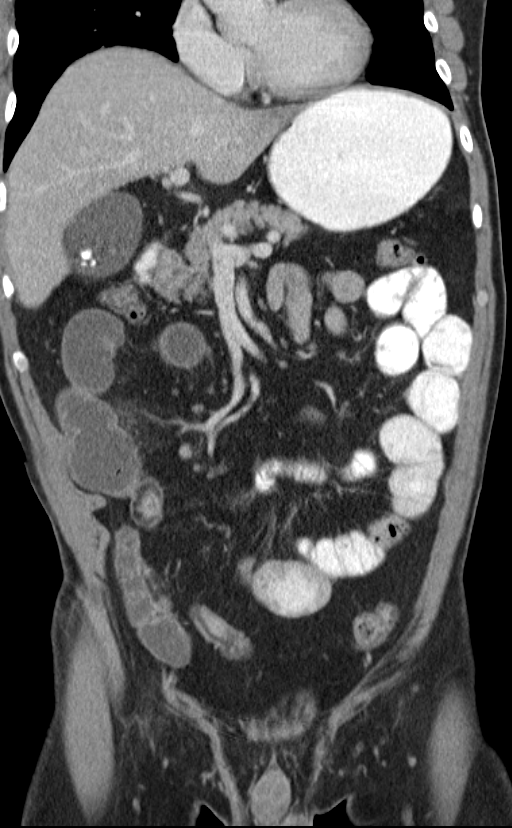
[im 49/89  soft-tissue]
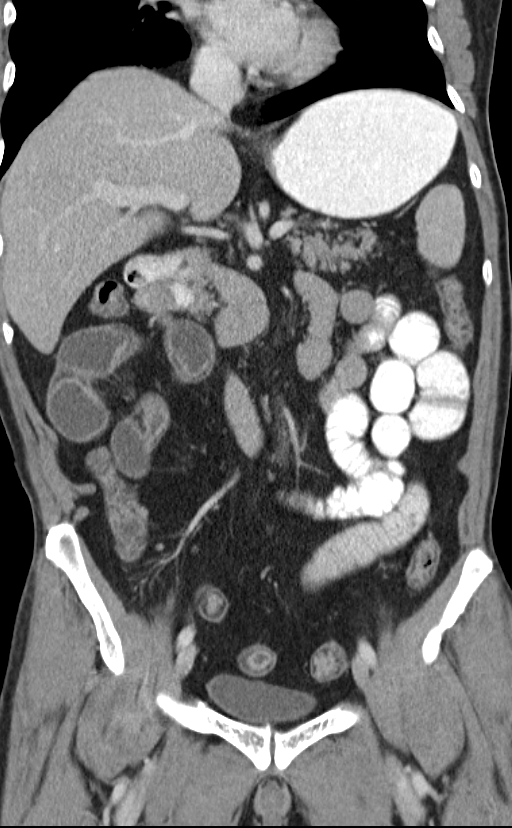

[15 of 46 positions shown; findings below may reference images not displayed]

FINDINGS: The lung bases are clear.

The liver is unremarkable. No focal lesions or biliary dilatation.
The gallbladder is distended and there are numerous layering
gallstones. No wall thickening or pericholecystic fluid. No common
bowel duct dilatation. The pancreas is normal. The spleen is normal.
The adrenal glands and kidneys are normal.

The stomach is mildly distended with contrast. There is a moderate
to large duodenum diverticulum noted. The mid distal small bowel
loops are dilated and fluid-filled. There is moderate mucosal
enhancement involving the distal and terminal ileum which suggests
acute inflammation/Crohn's disease. There is also fibrofatty
infiltrative changes involving the wall of the small bowel which is
consistent with chronic inflammatory bowel disease. No abscess or
obstruction.

No mesenteric or retroperitoneal mass or adenopathy. The aorta and
branch vessels are normal. The portal, splenic and renal veins are
patent. The colon demonstrates mild fibro fatty infiltrated changes
also consistent with remote inflammatory bowel disease. No acute
inflammation.

The bladder, prostate gland and seminal vesicles are unremarkable.
No pelvic mass or adenopathy. No free pelvic fluid collections. No
inguinal mass or adenopathy.

The bony structures are unremarkable.
IMPRESSION: Acute on chronic inflammation involving the distal and terminal
ileum suggesting active Crohn's disease superimposed on remote
disease. No obstruction or abscess. Mildly dilated more proximal
loops of small bowel likely due to a functional obstruction.

Cholelithiasis.

## 2014-10-07 NOTE — Consult Note (Signed)
Brief Consult Note: Diagnosis: Crohn's disease intolerant to prednisone.   Patient was seen by consultant.   Consult note dictated.   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  He has 12-Pattricia Bossyear history of Crohn's disease but had been off medications for many years & was not being followed by a gastroenterologist until 10/18-10/21 when he was admitted to [facility] & treated for partial small bowel obstruction along with inflammatory changes seen in terminal ileum on CT felt to be due to Crohn's.  He was treated with cipro, flagyl & prednisone taper.  He saw Dr Karilyn Cotaehman 11/4 & was given an Rx for 6mp but has not started yet.  He has had hallucinations, paranoia & insomnia while on prednisone. This has improved with withdrawal from the prednisone.  He is being managed by psychiatry.  His TPMT enzyme activity was normal.  Dr Servando SnareWohl & I interviewed & examined the patient together.  Given his intolerance of prednisone, budesonide which is more specific to GI tract & less likely to cause adverse side effects, would be a better option for him.  Also, while inpatient he should start mesalamine by way of pentasa 1 gram QID as a maintenance medication for his Crohn's.  He may need to revisit 6mp at a later date with Dr Karilyn Cotaehman if remission cannot be established with 5-ASA & budesonide.  Thanks for consult.  Please see full dictated note #[id number].Marland Kitchen.  Electronic Signatures: Lucas Rhodes, Lucas Rhodes (NP)  (Signed [phone]-Nov-14 23:47)  Authored: Brief Consult Note   Last Updated: 10-Nov-14 23:47 by Lucas Rhodes, Lucas Rhodes (NP)

## 2014-10-07 NOTE — H&P (Signed)
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 The patient is divorced 3 times after being married for 7 years, 2 years, and a few months. The patient has been living with his mother, who is 78 years old. The patient and mother live in a house. The patient comes for first inpatient psychiatry at [facility] with the chief complaint "This problem started happening since I was diagnosed with Crohn's disease 3 weeks ago, when I was admitted to [facility]."  HISTORY OF PRESENT ILLNESS: The patient reported that he was admitted to [facility] for stomach problems, and they did all sorts of tests and diagnosed him with Crohn's disease. He was given follow up appointments for the same. However, according to information obtained from the chart, the patient brought on IBC that states that he told his mother recently that they cannot talk inside the house because people are listening on the phone, and the house is bugged. He claimed that he moved to [place] because people were trying to kill him, and he sleeps with iron pipes and knives by his bedside. He began talking to himself frequently over the past few days, and has gone around to family members begging for forgiveness. He finally told his sister and mother on 04/24/2013 that his head was going to explode and blood has gone to it because God was punishing him. The patient reports that he did not talk about head and blood coming out, but he feels that God is correcting him. In addition, the patient reports he started having problems a month ago when he was  diagnosed with Crohn's disease.  PAST PSYCHIATRIC HISTORY:  No previous history of inpatient psychiatry. No history of suicide attempt. Not being followed by a psychiatrist.  FAMILY HISTORY OF MENTAL ILLNESS:  No history of suicide in the family.  FAMILY HISTORY: Raised by parents. Father worked. Father died of old age, natural causes. Mother worked as a Neurosurgeon. Mother living at 28 years old. Has 1 brother and 2 sisters. They are scattered around, so they cannot be close to each other.  PERSONAL HISTORY:  Born in [place], [place]. Moved to [place] in early 70s. Dropped out in 9th grade to go to work. No GED. 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 Military history:  None. Marriages:  He was married 3 times. First marriage lasted 7 years, second marriage lasted 2 years, third marriage lasted a few months.  Cause of divorce:  Could not get along. Has 3 children, they are all in their mid-30s and they are all from the first wife. He is close to his children, but not as close as he wishes to be. Alcohol and drugs:  Denies drinking alcohol, denies street or prescription drug abuse. Admits that he smoked nicotine cigarettes, but quit smoking 25 years ago.  MEDICAL HISTORY:  No known history of high blood pressure, but recently with stomach problems, blood pressure has been elevated. No known history of diabetes mellitus. No major surgeries. No major injuries. No history of motor vehicle accident or being unconscious.  Was diagnosed with Crohn's disease about 3 weeks ago at [facility]. [place]. Last appointment 04/20/2013, next appointment 05/03/2013.   ALLERGIES:  No known drug allergies.  PHYSICAL EXAMINATION:  VITAL SIGNS:  Temperature is 98.2, pulse is 90 per minute, regular, respirations 20 per minute, regular, blood pressure 138/90 mmHg. HEENT:  Head is normocephalic,  atraumatic. Eyes PERLA. Fundi benign. NECK:  Supple, without any organomegaly, lymphadenopathy, thyromegaly. CHEST:  Normal excursion. Normal breath sounds heard. HEART:  Normal S1, S2, without any murmurs or gallops. ABDOMEN:  Soft. No organomegaly. Bowel sounds heard.  RECTAL AND PELVIC:  Deferred. NEUROLOGIC:  Gait is normal. Romberg is negative. Cranial nerves II to XII intact, normal.  MENTAL STATUS EXAMINATION:  The patient is dressed in hospital scrubs. Alert and oriented to place, person, time. Does admit to feeling low, down and depressed recently since he was diagnosed with Crohn's disease, and is worried about the same. Denies feeling hopeless or helpless, but does feel worried, and he does not feel his normal self since he was diagnosed with Crohn's disease. He is worried a lot about the same. Does admit that he feels that God is correcting him, but denies any other statements in the IBC. Denies auditory or visual hallucinations. Denies paranoid thinking. Knew how many quarters, dimes, nickels, pennies in a dollar. He could spell the word WORLD forward and backward without any problems. He knew the [address] [place], name of the current president. He could count money. Denies any suicidal or homicidal ideas or plans. Insight and judgment guarded.  IMPRESSION:  AXIS I:  Major depressive disorder with psychosis, single episode to be ruled out. 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 AXIS II:  Deferred. AXIS III:  Diagnosed with Crohn's disease 3 weeks ago, which has caused lots of stress in the family and patient himself. AXIS IV:  Severe. Diagnosis of Crohn's disease has really bothered him a great deal and caused stress. AXIS V:  GAF 25.  PLAN:  The patient is admitted to [facility] for close observation and help. He will be started on Remeron, which will help him with  his anxiety and depression, and help him rest at night. During his stay in the hospital, he will be given meds for depression and supportive counseling, and his medications will be adjusted so that his depression and thoughts will be under control. Social Services will contact the family to find out more details about him, along with information about his Crohn's disease. The patient will be stabilized, and appropriate follow up will be made.     ____________________________ Jannet MantisSurya K. Guss Bunde[place]alla, MD skc:mr D: 04/25/2013 14:36:00 ET T: 04/25/2013 17:07:01 ET JOB#: [id number]  cc: Monika SalkSurya K. Guss Bunde[place]alla, MD, <Dictator> Beau FannySURYA K Adriannah Steinkamp MD ELECTRONICALLY SIGNED 04/26/2013 9:07

## 2014-10-07 NOTE — Consult Note (Signed)
Chief Complaint:  Subjective/Chief Complaint Pt denies abdominal pain, nausea, vomiting or diarrhea.  Denies rectal bleeding.  States anxiety resolving & sleeping better.   VITAL SIGNS/ANCILLARY NOTES: **Vital Signs.:   11-Nov-14 07:16  Temperature Temperature (F) 98.2  Celsius 36.7  Pulse Pulse 101  Respirations Respirations 20  Systolic BP Systolic BP 211  Diastolic BP (mmHg) Diastolic BP (mmHg) 89  Systolic BP Systolic BP 941  Diastolic BP (mmHg) Diastolic BP (mmHg) 78   Brief Assessment:  GEN well developed, thin, A/Ox3.   Cardiac Regular   Respiratory normal resp effort   Gastrointestinal Normal   Gastrointestinal details normal Soft  Nontender  Nondistended  No masses palpable  Bowel sounds normal  No rebound tenderness  No rigidity   EXTR negative cyanosis/clubbing, negative edema   Additional Physical Exam Skin: pink, warm, dry   Lab Results: Thyroid:  09-Nov-14 03:23   Thyroid Stimulating Hormone 0.59 (0.45-4.50 (International Unit)  ----------------------- Pregnant patients have  different reference  ranges for TSH:  - - - - - - - - - -  Pregnant, first trimetser:  0.36 - 2.50 uIU/mL)  Hepatic:  09-Nov-14 03:23   Bilirubin, Total  1.2  Alkaline Phosphatase 88  SGPT (ALT) 32  SGOT (AST) 25  Total Protein, Serum 6.5  Albumin, Serum 3.4  Routine Chem:  09-Nov-14 03:23   Glucose, Serum  108  BUN  5  Creatinine (comp) 0.94  Sodium, Serum 137  Potassium, Serum 3.7  Chloride, Serum 106  CO2, Serum 25  Calcium (Total), Serum 8.9  Osmolality (calc) 272  eGFR (African American) >60  eGFR (Non-African American) >60 (eGFR values <62m/min/1.73 m2 may be an indication of chronic kidney disease (CKD). Calculated eGFR is useful in patients with stable renal function. The eGFR calculation will not be reliable in acutely ill patients when serum creatinine is changing rapidly. It is not useful in  patients on dialysis. The eGFR calculation may not be  applicable to patients at the low and high extremes of body sizes, pregnant women, and vegetarians.)  Anion Gap  6  Ethanol, S. < 3  Ethanol % (comp) < 0.003 (Result(s) reported on 25 Apr 2013 at 04:21AM.)  Urine Drugs:  [id number]  Tricyclic Antidepressant, Ur Qual (comp) NEGATIVE (Result(s) reported on 25 Apr 2013 at 04:27AM.)  Amphetamines, Urine Qual. NEGATIVE  MDMA, Urine Qual. NEGATIVE  Cocaine Metabolite, Urine Qual. NEGATIVE  Opiate, Urine qual NEGATIVE  Phencyclidine, Urine Qual. NEGATIVE  Cannabinoid, Urine Qual. NEGATIVE  Barbiturates, Urine Qual. NEGATIVE  Benzodiazepine, Urine Qual. NEGATIVE (----------------- The URINE DRUG SCREEN provides only a preliminary, unconfirmed analytical test result and should not be used for non-medical  purposes.  Clinical consideration and professional judgment should be  applied to any positive drug screen result due to possible interfering substances.  A more specific alternate chemical method must be used in order to obtain a confirmed analytical result.  Gas chromatography/mass spectrometry (GC/MS) is the preferred confirmatory method.)  Methadone, Urine Qual. NEGATIVE  Routine UA:  09-Nov-14 03:23   Color (UA) Colorless  Clarity (UA) Clear  Glucose (UA) Negative  Bilirubin (UA) Negative  Ketones (UA) Negative  Specific Gravity (UA) 1.001  Blood (UA) Negative  pH (UA) 7.0  Protein (UA) Negative  Nitrite (UA) Negative  Leukocyte Esterase (UA) Negative (Result(s) reported on 25 Apr 2013 at 04:52AM.)  RBC (UA) <1 /HPF  WBC (UA) <1 /HPF  Bacteria (UA) NONE SEEN  Routine Hem:  09-Nov-14 03:23   WBC (  CBC) 8.9  RBC (CBC) 5.55  Hemoglobin (CBC) 15.1  Hematocrit (CBC) 44.6  Platelet Count (CBC) 344 (Result(s) reported on 25 Apr 2013 at 04:01AM.)  MCV 80  MCH 27.2  MCHC 33.8  RDW  15.7   Assessment/Plan:  Assessment/Plan:  Assessment Crohn's disease with recent flare requiring hospitalization for pSBO: Doing well  off prednisone.  Steroid-induced psychoses symptoms improving.  Pt should continue budesonide & 5-ASA until seen by Dr Laural Golden.   Plan 1) continue pentasa 1 Gram QID 2) entocort 76m daily 3) follow up outpatient with Dr RLaural GoldenWill sign off, Please call if you have any questions or concerns   Electronic Signatures: JAndria Meuse(NP)  (Signed 11-Nov-14 12:12)  Authored: Chief Complaint, VITAL SIGNS/ANCILLARY NOTES, Brief Assessment, Lab Results, Assessment/Plan   Last Updated: 11-Nov-14 12:12 by JAndria Meuse(NP)

## 2014-10-07 NOTE — Consult Note (Signed)
PATIENT NAME:  Lucas Rhodes, Lucas Rhodes MR#:  [id number] DATE OF BIRTH:  1956-02-26  DATE OF CONSULTATION:  04/26/2013  REFERRING PHYSICIAN:  Dr. Bary Leriche CONSULTING PHYSICIAN:  Andria Meuse, NP  PRIMARY GASTROENTEROLOGIST: Dr. Lucienne Minks   CONSULTING GASTROENTEROLOGIST: Dr. Lucilla Lame.   PRIMARY CARE PHYSICIAN: Not applicable.   REASON FOR CONSULTATION: Small bowel Crohn disease.   HISTORY OF PRESENT ILLNESS: The patient is a 59 year old Caucasian male who was admitted with depression with psychotic features. He has a 12-year history of Crohn disease, who was not on maintenance therapy, who was admitted to [facility] 04/03/2013 through 04/06/2013 with partial small bowel obstruction that responded to medical treatment. He had a CT scan of the abdomen and pelvis which showed terminal ileal changes consistent with Crohn disease. He was discharged on prednisone, Cipro and metronidazole. He followed up with Dr. Lucienne Minks 04/20/2013. He was given a prescription for 6-MP, but he did not start it  yet. He was having dizziness and tinnitus on prednisone, and therefore his dose was cut back. He tells me he has had severe insomnia and jitteriness. He went almost three weeks without sleeping at all. He began to have hallucinations prior to his admission. His weight has been stable. He denies any abdominal pain, nausea, vomiting, fever or chills. He denies any diarrhea, rectal bleeding, melena or mucus in his stools. He had been steroid naive prior to this episode. He denies any significant NSAID use. He had TPMT assay, which was negative. His C-reactive protein on 04/04/2013 was 4.8.   PAST MEDICAL: Small bowel Crohn disease, followed by Dr. Lucienne Minks, partial small bowel obstruction 04/03/2013, resolved with medical management.   PAST SURGICAL HISTORY: Denies any.   MEDICATIONS PRIOR TO ADMISSION: Prednisone 20 mg daily.   ALLERGIES: NO DRUG ALLERGIES.   FAMILY HISTORY: There is no  known family history of colorectal carcinoma, liver or chronic GI problems.   SOCIAL HISTORY: He resides with his mother. He is self-employed, doing brick work and Biomedical scientist. He has three grown children who are healthy. Denies any alcohol or illicit drug use, and quit smoking 25 years ago.   REVIEW OF SYSTEMS:  See history of present illness. PSYCHIATRIC: He has had some suicidal thoughts and is being followed by a psychiatrist for this.  Otherwise, negative review of systems.   PHYSICAL EXAMINATION: VITAL SIGNS: Temperature 98.5, pulse 115, respirations 20, blood pressure 147/98.  GENERAL: He is a thin, anxious-appearing Caucasian male who is alert, oriented, pleasant and cooperative.  HEENT: Sclerae clear, anicteric. Conjunctivae pink. Oropharynx pink and moist without any lesions. Poor dentition.  NECK: Supple, without any mass or thyromegaly.  CHEST: Heart regular rate and rhythm. Normal S1, S2, without murmurs, clicks, rubs or gallops.  LUNGS: Clear to auscultation bilaterally.  ABDOMEN: Positive bowel sounds x4. No bruits auscultated. Abdomen is soft, nontender, nondistended, without palpable mass or hepatosplenomegaly, no rebound tenderness or guarding.  EXTREMITIES: Without clubbing or edema.  SKIN: Pink, warm and dry without any rash or jaundice.  MUSCULOSKELETAL: Good equal movement bilaterally.  PSYCHIATRIC: He does appear to have an anxious mood. Affect is somewhat flat.  NEUROLOGIC:  Grossly intact.   LABORATORY STUDIES: Glucose 108, BUN 5, otherwise normal met-7. Serum ethanol less than 3. Total bilirubin 1.2, otherwise normal LFTs. TSH is normal. Urine drug screen is negative. CBC is normal except RDW is 15.7. Urinalysis is normal.   IMPRESSION: The patient is a 59 year old Caucasian male who was admitted with depression and  psychosis, likely induced by prednisone. He has a 12-year history of Crohn disease but had been off medications for many years and was not being followed  by a gastroenterologist until 10/18 to 10/21 when he was admitted to [facility] and treated for partial small bowel obstruction along with inflammatory changes seen in the terminal ileum on CT, felt to be due to Crohn's. He was treated with Cipro, Flagyl and prednisone taper. He saw Dr. Corbin Ade on 11/04 and was given a prescription for 6-MP, but has not yet started. He has had hallucinations, paranoia, and insomnia while on prednisone. This has improved with withdrawal from the prednisone. He is being managed by psychiatry. His TPMT enzyme activity was normal. Dr. Allen Norris and I interviewed and examined the patient together. Given his intolerance of prednisone, budesonide, which is more specific to the gastrointestinal tract and less likely to cause adverse side effects, would be a better option for him. Also while inpatient, he should start mesalamine by way of Pentasa 1 gram q.i.d. as a maintenance medication for his Crohn's; however, this will take approximately six weeks to reach a therapeutic level. He may need to revisit 6-MP at a later date with Dr. Laural Golden if remission cannot be established with 5-ASA and budesonide.   PLAN: 1.  Begin Pentasa 1 gram q.i.d.  2.  No further prednisone.  3.  Budesonide (Entocort) 9 milligrams daily, and this will need to be tapered as an outpatient.  4.  He should follow up with Dr. Laural Golden for further management of his Crohn's as an outpatient.  We would like to thank you for allowing [place] to participate in the care of this patient.    ____________________________ Andria Meuse, NP  DICTATED FOR:  Dr. Lucilla Lame klj:cg D: 04/26/2013 23:59:28 ET T: 04/27/2013 00:45:16 ET JOB#: [id number]  cc: Andria Meuse, NP, <Dictator> Dr. Lucienne Minks, [place], [place] SIGNED 05/24/2013 15:28
